# Patient Record
Sex: Female | Born: 1945 | Race: Black or African American | Hispanic: No | Marital: Married | Smoking: Former smoker
Health system: Southern US, Community
[De-identification: ages and names within clinical notes are randomized; demographics above are authoritative.]

## PROBLEM LIST (undated history)

## (undated) DIAGNOSIS — E876 Hypokalemia: Secondary | ICD-10-CM

## (undated) DIAGNOSIS — Z8719 Personal history of other diseases of the digestive system: Secondary | ICD-10-CM

## (undated) DIAGNOSIS — R0602 Shortness of breath: Secondary | ICD-10-CM

## (undated) DIAGNOSIS — J449 Chronic obstructive pulmonary disease, unspecified: Secondary | ICD-10-CM

## (undated) DIAGNOSIS — J189 Pneumonia, unspecified organism: Secondary | ICD-10-CM

## (undated) DIAGNOSIS — I519 Heart disease, unspecified: Secondary | ICD-10-CM

## (undated) DIAGNOSIS — I509 Heart failure, unspecified: Secondary | ICD-10-CM

## (undated) DIAGNOSIS — K219 Gastro-esophageal reflux disease without esophagitis: Secondary | ICD-10-CM

## (undated) DIAGNOSIS — M1990 Unspecified osteoarthritis, unspecified site: Secondary | ICD-10-CM

## (undated) DIAGNOSIS — N189 Chronic kidney disease, unspecified: Secondary | ICD-10-CM

## (undated) DIAGNOSIS — K5730 Diverticulosis of large intestine without perforation or abscess without bleeding: Secondary | ICD-10-CM

## (undated) DIAGNOSIS — I10 Essential (primary) hypertension: Secondary | ICD-10-CM

## (undated) DIAGNOSIS — R51 Headache: Secondary | ICD-10-CM

## (undated) HISTORY — PX: SEPTOPLASTY: SUR1290

## (undated) HISTORY — PX: BILATERAL SALPINGOOPHORECTOMY: SHX1223

## (undated) HISTORY — PX: CYSTOSCOPY W/ URETEROSCOPY W/ LITHOTRIPSY: SUR380

## (undated) HISTORY — PX: CHOLECYSTECTOMY: SHX55

## (undated) HISTORY — PX: LITHOTRIPSY: SUR834

## (undated) HISTORY — PX: TONSILLECTOMY: SUR1361

## (undated) HISTORY — PX: CYSTOSCOPY/RETROGRADE/URETEROSCOPY/STONE EXTRACTION WITH BASKET: SHX5317

## (undated) HISTORY — PX: ABDOMINAL HYSTERECTOMY: SHX81

## (undated) HISTORY — PX: COLONOSCOPY W/ BIOPSIES AND POLYPECTOMY: SHX1376

---

## 2008-05-26 ENCOUNTER — Emergency Department (HOSPITAL_COMMUNITY): Admission: EM | Admit: 2008-05-26 | Discharge: 2008-05-27 | Payer: Self-pay | Admitting: Emergency Medicine

## 2010-07-24 IMAGING — CR DG CHEST 2V
2 series · 2 of 2 positions shown · non-contrast
Comparison: None

CLINICAL DATA: Chest pain.  Tachycardia.  Dizziness.

CHEST - 2 VIEW

[w chest pa]
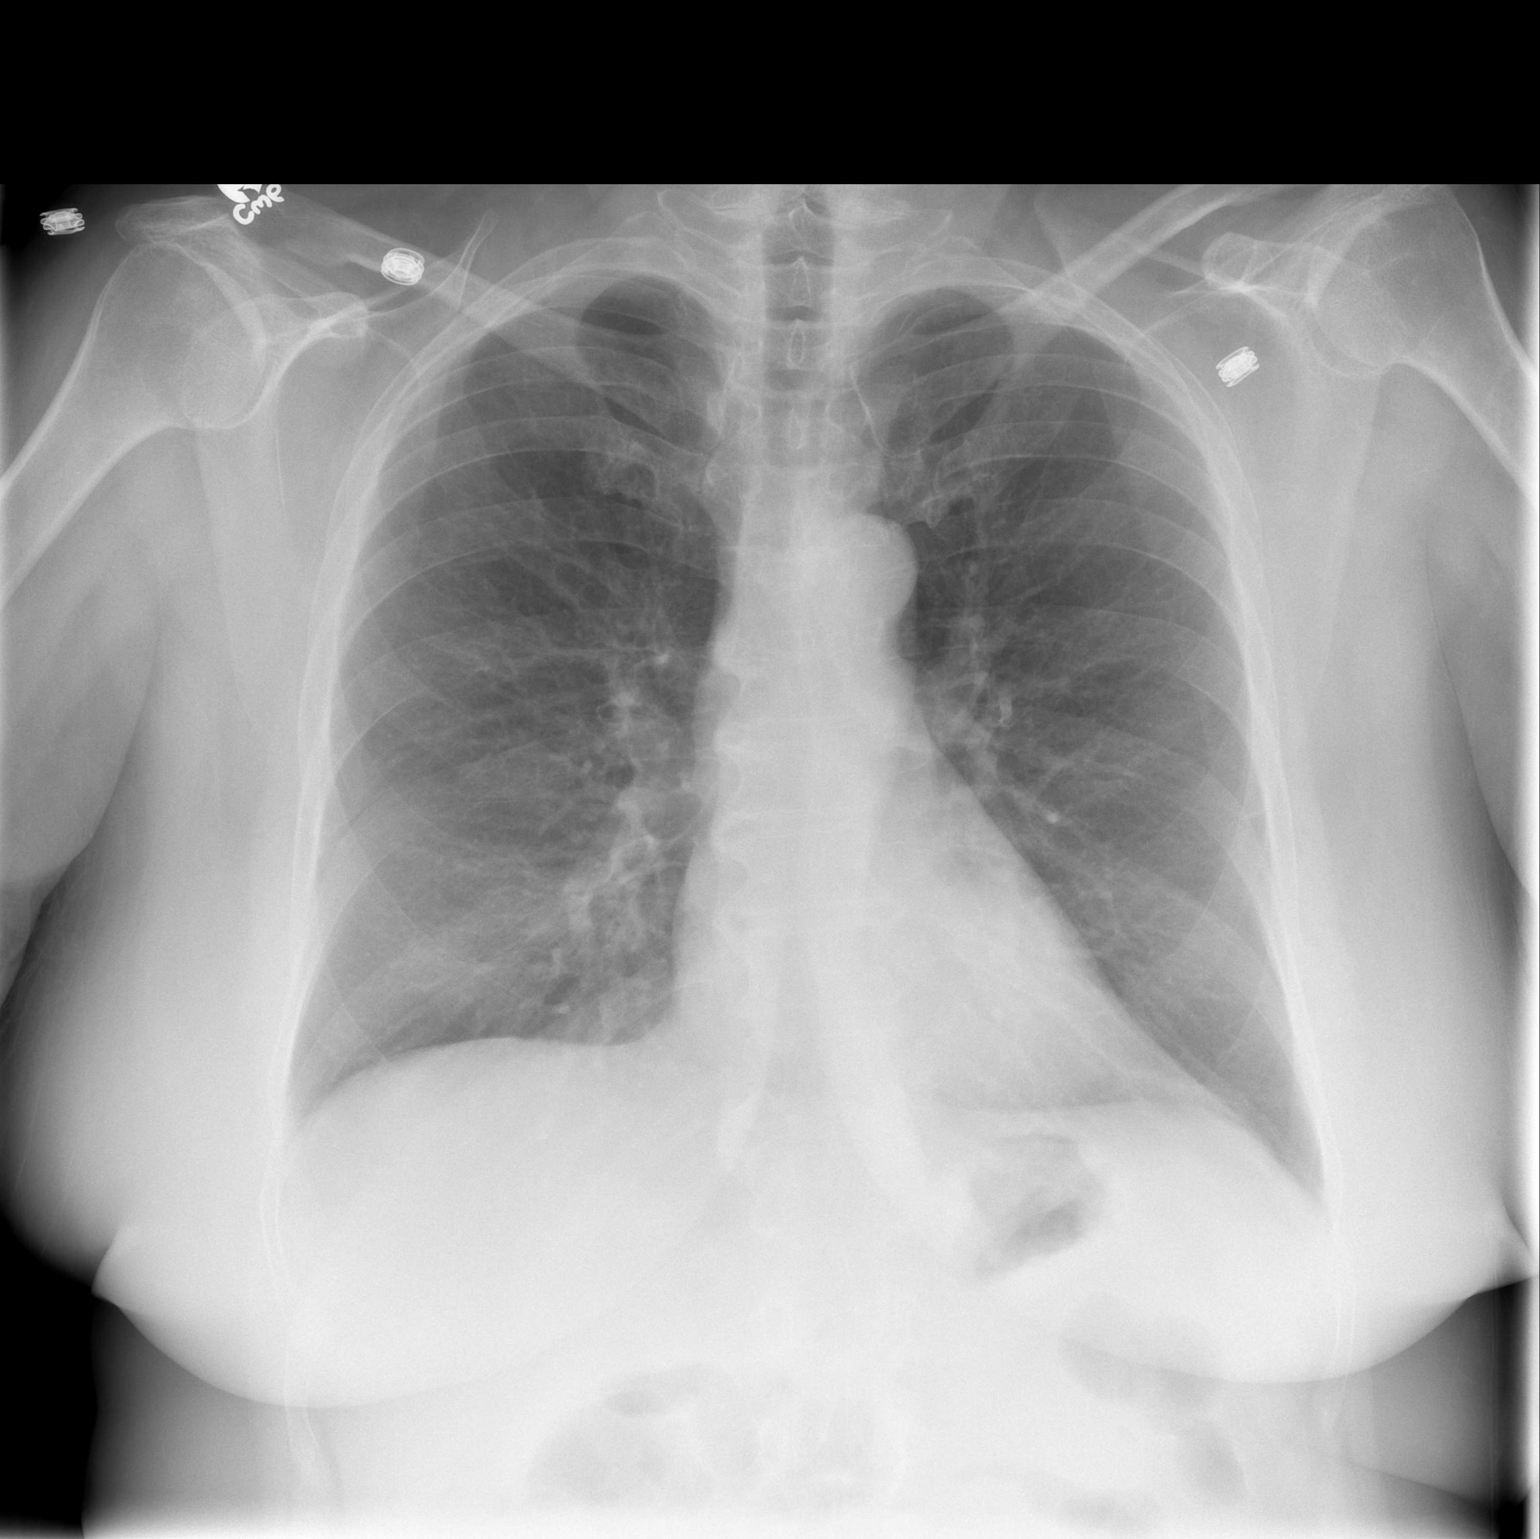

[w chest lat]
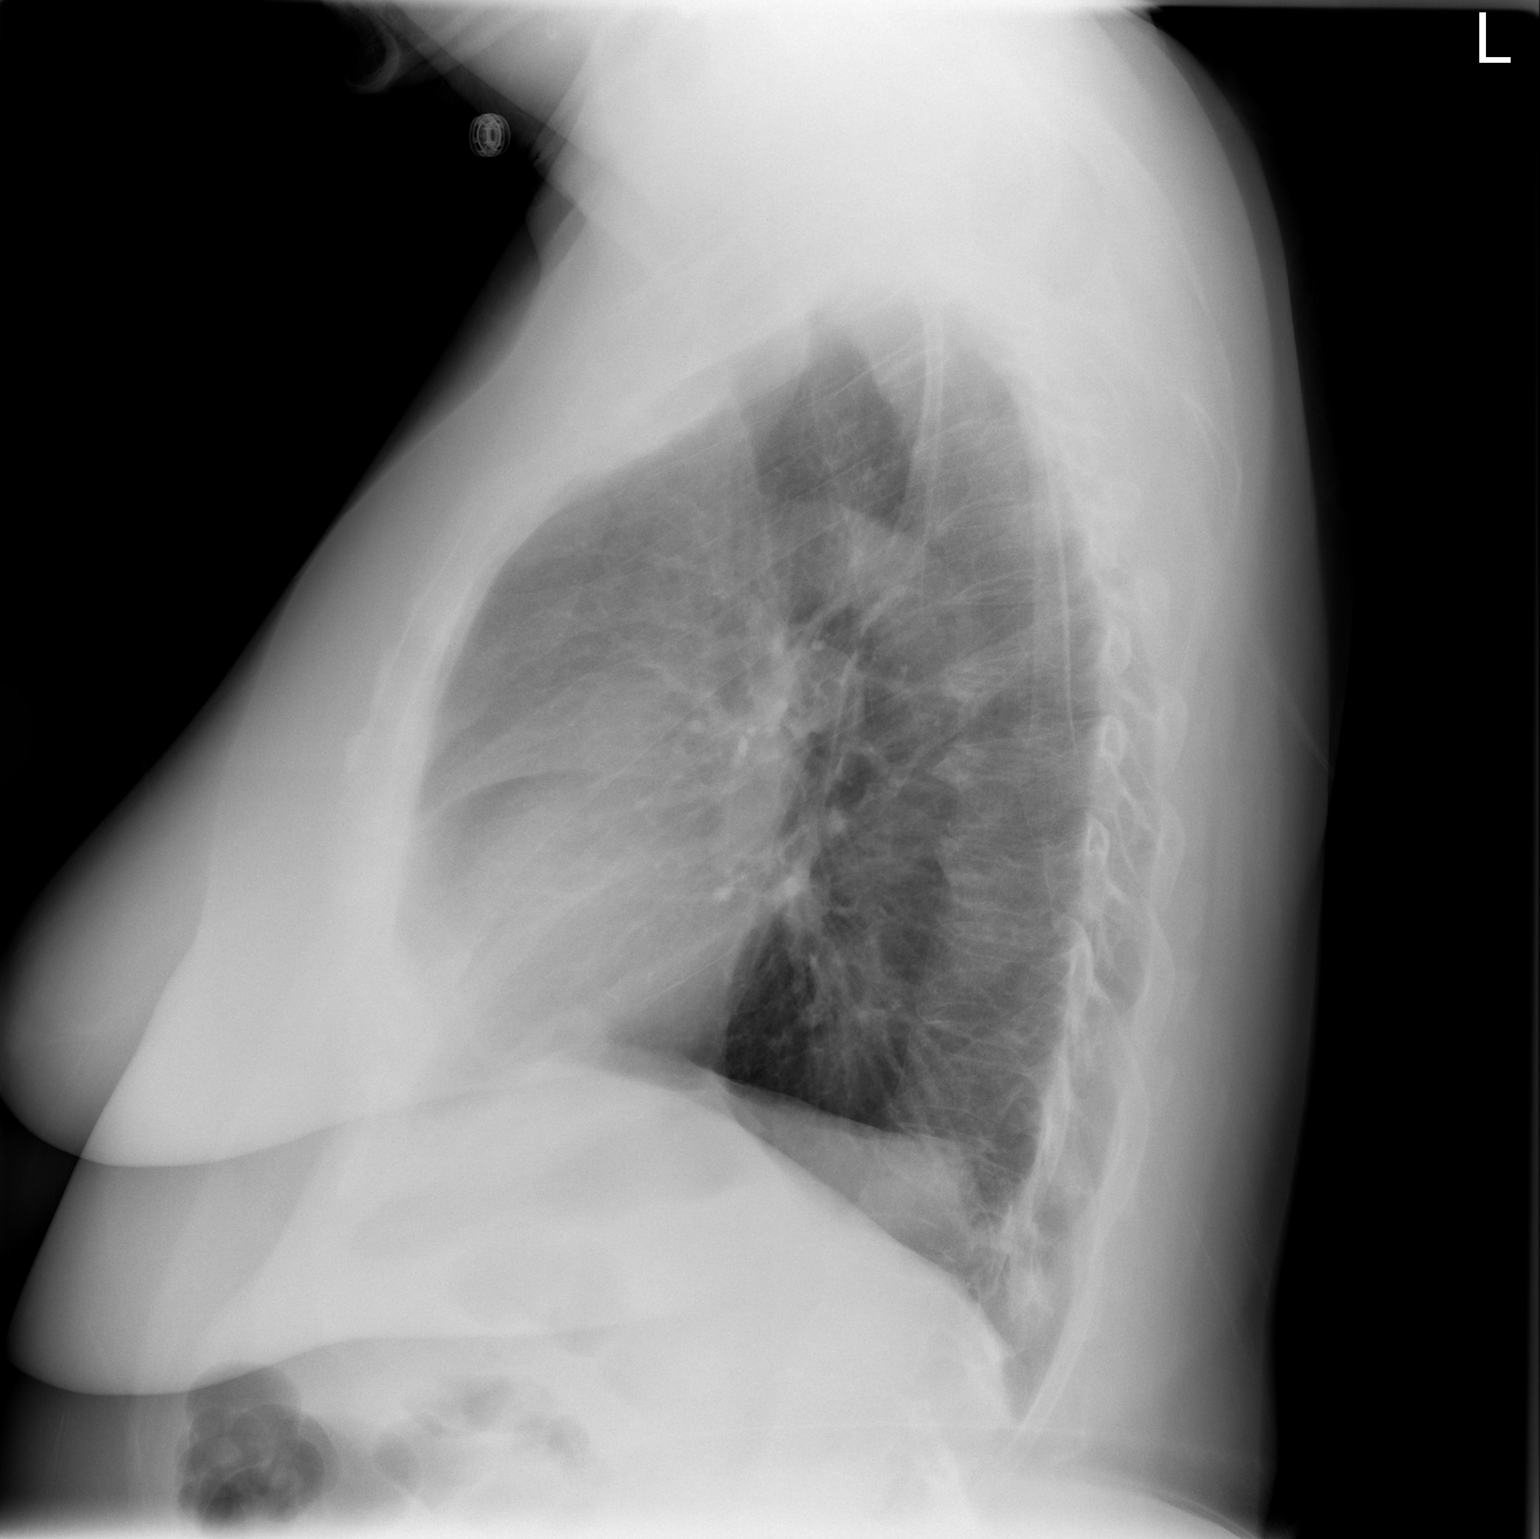

[2 of 2 positions shown; findings below may reference images not displayed]

FINDINGS: Heart size and mediastinal contours are normal.  Both
lungs are clear. There is no evidence of pleural effusion.  No mass
or adenopathy identified.
IMPRESSION: No active cardiopulmonary disease.

## 2010-07-27 LAB — BASIC METABOLIC PANEL
BUN: 8 mg/dL (ref 6–23)
CO2: 26 mEq/L (ref 19–32)
Calcium: 9.4 mg/dL (ref 8.4–10.5)
Glucose, Bld: 106 mg/dL — ABNORMAL HIGH (ref 70–99)
Sodium: 135 mEq/L (ref 135–145)

## 2010-07-27 LAB — DIFFERENTIAL
Basophils Absolute: 0.1 10*3/uL (ref 0.0–0.1)
Basophils Relative: 1 % (ref 0–1)
Eosinophils Relative: 1 % (ref 0–5)
Monocytes Absolute: 0.6 10*3/uL (ref 0.1–1.0)
Neutro Abs: 6.2 10*3/uL (ref 1.7–7.7)

## 2010-07-27 LAB — CBC
HCT: 40.6 % (ref 36.0–46.0)
Hemoglobin: 14 g/dL (ref 12.0–15.0)
MCHC: 34.5 g/dL (ref 30.0–36.0)
Platelets: 281 10*3/uL (ref 150–400)
RDW: 15.1 % (ref 11.5–15.5)

## 2010-07-27 LAB — POCT CARDIAC MARKERS

## 2011-08-22 ENCOUNTER — Encounter (HOSPITAL_BASED_OUTPATIENT_CLINIC_OR_DEPARTMENT_OTHER): Payer: Self-pay | Admitting: *Deleted

## 2011-08-22 ENCOUNTER — Other Ambulatory Visit: Payer: Self-pay | Admitting: *Deleted

## 2011-08-22 NOTE — Progress Notes (Signed)
Pt disability/asthma-had to give me a dx for every medication sh took-was an LNP-[place] cardi[place]will call for notes Asthma controlled-to bring all meds and inhaler-neb meds-may need neb tx prior to surg Wanted her to take lasix, but has a 2 hr drive here Need [address]

## 2011-08-24 ENCOUNTER — Encounter (HOSPITAL_BASED_OUTPATIENT_CLINIC_OR_DEPARTMENT_OTHER): Payer: Self-pay | Admitting: *Deleted

## 2011-08-24 ENCOUNTER — Ambulatory Visit (HOSPITAL_BASED_OUTPATIENT_CLINIC_OR_DEPARTMENT_OTHER): Payer: Federal, State, Local not specified - PPO | Admitting: *Deleted

## 2011-08-24 ENCOUNTER — Encounter (HOSPITAL_BASED_OUTPATIENT_CLINIC_OR_DEPARTMENT_OTHER)
Admission: RE | Disposition: A | Discharge status: Home / Self Care | Payer: Self-pay | Source: Ambulatory Visit | Attending: Orthopedic Surgery

## 2011-08-24 ENCOUNTER — Ambulatory Visit (HOSPITAL_BASED_OUTPATIENT_CLINIC_OR_DEPARTMENT_OTHER)
Admission: RE | Admit: 2011-08-24 | Discharge: 2011-08-24 | Disposition: A | Discharge status: Home / Self Care | Payer: Federal, State, Local not specified - PPO | Source: Ambulatory Visit | Attending: Orthopedic Surgery | Admitting: Orthopedic Surgery

## 2011-08-24 DIAGNOSIS — J4489 Other specified chronic obstructive pulmonary disease: Secondary | ICD-10-CM | POA: Insufficient documentation

## 2011-08-24 DIAGNOSIS — M21619 Bunion of unspecified foot: Secondary | ICD-10-CM | POA: Insufficient documentation

## 2011-08-24 DIAGNOSIS — M2012 Hallux valgus (acquired), left foot: Secondary | ICD-10-CM

## 2011-08-24 DIAGNOSIS — N189 Chronic kidney disease, unspecified: Secondary | ICD-10-CM | POA: Insufficient documentation

## 2011-08-24 DIAGNOSIS — I129 Hypertensive chronic kidney disease with stage 1 through stage 4 chronic kidney disease, or unspecified chronic kidney disease: Secondary | ICD-10-CM | POA: Insufficient documentation

## 2011-08-24 DIAGNOSIS — K219 Gastro-esophageal reflux disease without esophagitis: Secondary | ICD-10-CM | POA: Insufficient documentation

## 2011-08-24 DIAGNOSIS — J449 Chronic obstructive pulmonary disease, unspecified: Secondary | ICD-10-CM | POA: Insufficient documentation

## 2011-08-24 HISTORY — DX: Gastro-esophageal reflux disease without esophagitis: K21.9

## 2011-08-24 HISTORY — DX: Essential (primary) hypertension: I10

## 2011-08-24 HISTORY — DX: Hypokalemia: E87.6

## 2011-08-24 HISTORY — DX: Chronic kidney disease, unspecified: N18.9

## 2011-08-24 HISTORY — DX: Chronic obstructive pulmonary disease, unspecified: J44.9

## 2011-08-24 HISTORY — DX: Diverticulosis of large intestine without perforation or abscess without bleeding: K57.30

## 2011-08-24 HISTORY — DX: Personal history of other diseases of the digestive system: Z87.19

## 2011-08-24 HISTORY — DX: Unspecified osteoarthritis, unspecified site: M19.90

## 2011-08-24 HISTORY — DX: Heart disease, unspecified: I51.9

## 2011-08-24 HISTORY — PX: BUNIONECTOMY: SHX129

## 2011-08-24 HISTORY — DX: Pneumonia, unspecified organism: J18.9

## 2011-08-24 HISTORY — DX: Shortness of breath: R06.02

## 2011-08-24 HISTORY — DX: Heart failure, unspecified: I50.9

## 2011-08-24 HISTORY — DX: Headache: R51

## 2011-08-24 LAB — POCT I-STAT, CHEM 8
Calcium, Ion: 1.24 mmol/L (ref 1.12–1.32)
Chloride: 104 mEq/L (ref 96–112)
Glucose, Bld: 103 mg/dL — ABNORMAL HIGH (ref 70–99)
Potassium: 3.4 mEq/L — ABNORMAL LOW (ref 3.5–5.1)
Sodium: 138 mEq/L (ref 135–145)
TCO2: 24 mmol/L (ref 0–100)

## 2011-08-24 SURGERY — BUNIONECTOMY
Anesthesia: General | Site: Foot | Laterality: Left | Wound class: Clean

## 2011-08-24 MED ORDER — LIDOCAINE-EPINEPHRINE 1.5-1:200000 % IJ SOLN
INTRAMUSCULAR | Status: DC | PRN
  Administered 2011-08-24: 30 mL via INTRADERMAL

## 2011-08-24 MED ORDER — OXYCODONE-ACETAMINOPHEN 5-325 MG PO TABS: 1.0000 | ORAL_TABLET | Freq: Four times a day (QID) | ORAL | Status: AC | PRN

## 2011-08-24 MED ORDER — PROPOFOL 10 MG/ML IV EMUL
INTRAVENOUS | Status: DC | PRN
  Administered 2011-08-24: 100 mg via INTRAVENOUS

## 2011-08-24 MED ORDER — MORPHINE SULFATE 4 MG/ML IJ SOLN: 0.0500 mg/kg | INTRAMUSCULAR | Status: DC | PRN

## 2011-08-24 MED ORDER — CEFAZOLIN SODIUM-DEXTROSE 2-3 GM-% IV SOLR
2.0000 g | INTRAVENOUS | Status: AC
  Administered 2011-08-24: 2 g via INTRAVENOUS

## 2011-08-24 MED ORDER — HYDROMORPHONE HCL PF 1 MG/ML IJ SOLN: 0.2500 mg | INTRAMUSCULAR | Status: DC | PRN

## 2011-08-24 MED ORDER — POVIDONE-IODINE 7.5 % EX SOLN: Freq: Once | CUTANEOUS | Status: DC

## 2011-08-24 MED ORDER — LACTATED RINGERS IV SOLN
INTRAVENOUS | Status: DC
  Administered 2011-08-24: 08:00:00 via INTRAVENOUS

## 2011-08-24 MED ORDER — LIDOCAINE HCL (CARDIAC) 20 MG/ML IV SOLN
INTRAVENOUS | Status: DC | PRN
  Administered 2011-08-24: 100 mg via INTRAVENOUS

## 2011-08-24 MED ORDER — MIDAZOLAM HCL 2 MG/2ML IJ SOLN
1.0000 mg | INTRAMUSCULAR | Status: DC | PRN
  Administered 2011-08-24: 1 mg via INTRAVENOUS

## 2011-08-24 MED ORDER — FENTANYL CITRATE 0.05 MG/ML IJ SOLN
50.0000 ug | INTRAMUSCULAR | Status: DC | PRN
  Administered 2011-08-24: 100 ug via INTRAVENOUS

## 2011-08-24 MED ORDER — ONDANSETRON HCL 4 MG/2ML IJ SOLN: 4.0000 mg | Freq: Once | INTRAMUSCULAR | Status: DC | PRN

## 2011-08-24 MED ORDER — BUPIVACAINE-EPINEPHRINE PF 0.5-1:200000 % IJ SOLN
INTRAMUSCULAR | Status: DC | PRN
  Administered 2011-08-24: 30 mL

## 2011-08-24 MED ORDER — ONDANSETRON HCL 4 MG/2ML IJ SOLN
INTRAMUSCULAR | Status: DC | PRN
  Administered 2011-08-24: 4 mg via INTRAVENOUS

## 2011-08-24 MED ORDER — OXYCODONE-ACETAMINOPHEN 5-325 MG PO TABS
1.0000 | ORAL_TABLET | Freq: Once | ORAL | Status: AC
  Administered 2011-08-24: 1 via ORAL

## 2011-08-24 MED ORDER — PROMETHAZINE HCL 25 MG PO TABS
25.0000 mg | ORAL_TABLET | Freq: Once | ORAL | Status: AC
  Administered 2011-08-24: 25 mg via ORAL

## 2011-08-24 MED ORDER — DEXAMETHASONE SODIUM PHOSPHATE 10 MG/ML IJ SOLN
INTRAMUSCULAR | Status: DC | PRN
  Administered 2011-08-24: 10 mg via INTRAVENOUS

## 2011-08-24 SURGICAL SUPPLY — 59 items
BANDAGE CONFORM 2  STR LF (GAUZE/BANDAGES/DRESSINGS) ×2 IMPLANT
BLADE AVERAGE 25X9 (BLADE) IMPLANT
BLADE OSC/SAG .038X5.5 CUT EDG (BLADE) IMPLANT
BLADE SURG 15 STRL LF DISP TIS (BLADE) ×2 IMPLANT
BLADE SURG 15 STRL SS (BLADE) ×2
BNDG ELASTIC 2 VLCR STRL LF (GAUZE/BANDAGES/DRESSINGS) ×4 IMPLANT
BNDG ESMARK 4X9 LF (GAUZE/BANDAGES/DRESSINGS) ×2 IMPLANT
CANISTER SUCTION 1200CC (MISCELLANEOUS) IMPLANT
CLOTH BEACON ORANGE TIMEOUT ST (SAFETY) ×2 IMPLANT
COVER TABLE BACK 60X90 (DRAPES) ×2 IMPLANT
CUFF TOURNIQUET SINGLE 18IN (TOURNIQUET CUFF) IMPLANT
CUFF TOURNIQUET SINGLE 24IN (TOURNIQUET CUFF) ×2 IMPLANT
DECANTER SPIKE VIAL GLASS SM (MISCELLANEOUS) IMPLANT
DRAPE EXTREMITY T 121X128X90 (DRAPE) ×2 IMPLANT
DRAPE OEC MINIVIEW 54X84 (DRAPES) ×2 IMPLANT
DRAPE U 20/CS (DRAPES) ×2 IMPLANT
DRAPE U-SHAPE 47X51 STRL (DRAPES) ×4 IMPLANT
DRSG EMULSION OIL 3X3 NADH (GAUZE/BANDAGES/DRESSINGS) IMPLANT
DURAPREP 26ML APPLICATOR (WOUND CARE) ×2 IMPLANT
ELECT REM PT RETURN 9FT ADLT (ELECTROSURGICAL) ×2
ELECTRODE REM PT RTRN 9FT ADLT (ELECTROSURGICAL) ×1 IMPLANT
GAUZE XEROFORM 1X8 LF (GAUZE/BANDAGES/DRESSINGS) IMPLANT
GLOVE BIO SURGEON STRL SZ 6.5 (GLOVE) ×2 IMPLANT
GLOVE BIOGEL PI IND STRL 8 (GLOVE) ×2 IMPLANT
GLOVE BIOGEL PI INDICATOR 8 (GLOVE) ×2
GLOVE ECLIPSE 7.5 STRL STRAW (GLOVE) ×4 IMPLANT
GLOVE INDICATOR 7.0 STRL GRN (GLOVE) ×2 IMPLANT
GOWN BRE IMP PREV XXLGXLNG (GOWN DISPOSABLE) ×2 IMPLANT
GOWN PREVENTION PLUS XLARGE (GOWN DISPOSABLE) ×2 IMPLANT
GOWN PREVENTION PLUS XXLARGE (GOWN DISPOSABLE) ×2 IMPLANT
KIT 1-PIN ORTHOSORB 1.3X40 (Pin) ×2 IMPLANT
KWIRE 4.0 X .045IN (WIRE) IMPLANT
NEEDLE 1/2 CIR CATGUT .05X1.09 (NEEDLE) IMPLANT
NEEDLE ADDISON D1/2 CIR (NEEDLE) IMPLANT
NEEDLE HYPO 25X1 1.5 SAFETY (NEEDLE) IMPLANT
NS IRRIG 1000ML POUR BTL (IV SOLUTION) ×2 IMPLANT
PACK BASIN DAY SURGERY FS (CUSTOM PROCEDURE TRAY) ×2 IMPLANT
PAD CAST 3X4 CTTN HI CHSV (CAST SUPPLIES) IMPLANT
PADDING CAST ABS 4INX4YD NS (CAST SUPPLIES) ×1
PADDING CAST ABS COTTON 4X4 ST (CAST SUPPLIES) ×1 IMPLANT
PADDING CAST COTTON 3X4 STRL (CAST SUPPLIES)
PADDING UNDERCAST 2  STERILE (CAST SUPPLIES) ×2 IMPLANT
PENCIL BUTTON HOLSTER BLD 10FT (ELECTRODE) ×2 IMPLANT
SPONGE GAUZE 4X4 12PLY (GAUZE/BANDAGES/DRESSINGS) ×2 IMPLANT
STOCKINETTE 4X48 STRL (DRAPES) ×2 IMPLANT
SUCTION FRAZIER TIP 10 FR DISP (SUCTIONS) ×2 IMPLANT
SUT ETHILON 3 0 PS 1 (SUTURE) IMPLANT
SUT ETHILON 4 0 PS 2 18 (SUTURE) ×2 IMPLANT
SUT FIBERWIRE 2-0 18 17.9 3/8 (SUTURE) ×2
SUT VIC AB 2-0 PS2 27 (SUTURE) IMPLANT
SUT VIC AB 3-0 FS2 27 (SUTURE) IMPLANT
SUTURE FIBERWR 2-0 18 17.9 3/8 (SUTURE) ×1 IMPLANT
SYR BULB 3OZ (MISCELLANEOUS) ×2 IMPLANT
SYR CONTROL 10ML LL (SYRINGE) IMPLANT
TOWEL OR 17X24 6PK STRL BLUE (TOWEL DISPOSABLE) ×2 IMPLANT
TOWEL OR NON WOVEN STRL DISP B (DISPOSABLE) ×2 IMPLANT
TUBE CONNECTING 20X1/4 (TUBING) ×2 IMPLANT
UNDERPAD 30X30 INCONTINENT (UNDERPADS AND DIAPERS) ×4 IMPLANT
WATER STERILE IRR 1000ML POUR (IV SOLUTION) ×2 IMPLANT

## 2011-08-24 NOTE — Transfer of Care (Signed)
Immediate Anesthesia Transfer of Care Note  Patient: Kelsey York  Procedure(s) Performed: Procedure(s) (LRB): BUNIONECTOMY (Left)  Patient Location: PACU  Anesthesia Type: GA combined with regional for post-op pain  Level of Consciousness: awake, alert  and oriented  Airway & Oxygen Therapy: Patient Spontanous Breathing and Patient connected to face mask oxygen  Post-op Assessment: Report given to PACU RN, Post -op Vital signs reviewed and stable and Patient moving all extremities  Post vital signs: Reviewed and stable  Complications: No apparent anesthesia complications

## 2011-08-24 NOTE — H&P (Signed)
PREOPERATIVE H&P  Chief Complaint: l. Foot pain and bunion  HPI: Kelsey York is a 66 y.o. female who presents for evaluation of l. Foot pain. It has been present for greater than 1 year and has been worsening. She has failed conservative measures. Pain is rated as severe.  Past Medical History  Diagnosis Date  . COPD (chronic obstructive pulmonary disease)   . Hypertension   . Asthma   . CHF (congestive heart failure)   . Shortness of breath   . Pneumonia   . Chronic kidney disease   . GERD (gastroesophageal reflux disease)   . H/O hiatal hernia   . Headache   . Arthritis   . Diverticula, colon   . Hypokalemia   . Left ventricular dysfunction    Past Surgical History  Procedure Date  . Cholecystectomy   . Tonsillectomy   . Septoplasty   . Lithotripsy   . Cystoscopy w/ ureteroscopy w/ lithotripsy   . Cystoscopy/retrograde/ureteroscopy/stone extraction with basket   . Colonoscopy w/ biopsies and polypectomy   . Abdominal hysterectomy   . Bilateral salpingoophorectomy    History   Social History  . Marital Status: Married    Spouse Name: N/A    Number of Children: N/A  . Years of Education: N/A   Social History Main Topics  . Smoking status: Former Smoker    Quit date: 08/21/1989  . Smokeless tobacco: None  . Alcohol Use: Yes  . Drug Use: No  . Sexually Active:    Other Topics Concern  . None   Social History Narrative  . None   History reviewed. No pertinent family history. Allergies  Allergen Reactions  . Ampicillin Nausea And Vomiting  . Codeine Nausea And Vomiting  . Darvocet A500 (Propoxyphene N-Acetaminophen) Nausea And Vomiting  . Dilantin (Phenytoin Sodium Extended) Hives  . Flagyl (Metronidazole) Swelling  . Morphine And Related Itching  . Sulfa Antibiotics Hives   Prior to Admission medications   Medication Sig Start Date End Date Taking? Authorizing Provider  acidophilus (RISAQUAD) CAPS Take by mouth daily.   Yes Historical Provider,  MD  albuterol (PROVENTIL) (5 MG/ML) 0.5% nebulizer solution Take 2.5 mg by nebulization every 6 (six) hours as needed.   Yes Historical Provider, MD  albuterol-ipratropium (COMBIVENT) 18-103 MCG/ACT inhaler Inhale 2 puffs into the lungs every 6 (six) hours as needed.   Yes Historical Provider, MD  budesonide-formoterol (SYMBICORT) 160-4.5 MCG/ACT inhaler Inhale 2 puffs into the lungs 2 (two) times daily.   Yes Historical Provider, MD  cyclobenzaprine (FLEXERIL) 10 MG tablet Take 10 mg by mouth 3 (three) times daily as needed.   Yes Historical Provider, MD  EPINEPHrine (EPIPEN JR) 0.15 MG/0.3ML injection Inject 0.15 mg into the muscle as needed.   Yes Historical Provider, MD  famotidine (PEPCID) 40 MG tablet Take 40 mg by mouth at bedtime.   Yes Historical Provider, MD  furosemide (LASIX) 80 MG tablet Take 80 mg by mouth 1 day or 1 dose.   Yes Historical Provider, MD  lisinopril (PRINIVIL,ZESTRIL) 2.5 MG tablet Take 2.5 mg by mouth daily.   Yes Historical Provider, MD  mometasone (NASONEX) 50 MCG/ACT nasal spray Place 2 sprays into the nose daily.   Yes Historical Provider, MD  montelukast (SINGULAIR) 10 MG tablet Take 10 mg by mouth at bedtime.   Yes Historical Provider, MD  omeprazole (PRILOSEC) 20 MG capsule Take 20 mg by mouth daily.   Yes Historical Provider, MD  potassium chloride (K-DUR,KLOR-CON) 10 MEQ  tablet Take 10 mEq by mouth 2 (two) times daily.   Yes Historical Provider, MD  verapamil (CALAN-SR) 240 MG CR tablet Take 240 mg by mouth at bedtime.   Yes Historical Provider, MD  guaiFENesin (MUCINEX) 600 MG 12 hr tablet Take 1,200 mg by mouth 2 (two) times daily.    Historical Provider, MD  phenazopyridine (PYRIDIUM) 200 MG tablet Take 200 mg by mouth 3 (three) times daily as needed.    Historical Provider, MD  promethazine (PHENERGAN) 25 MG tablet Take 25 mg by mouth every 6 (six) hours as needed.    Historical Provider, MD     Positive ROS: none  All other systems have been reviewed  and were otherwise negative with the exception of those mentioned in the HPI and as above.  Physical Exam: Filed Vitals:   08/24/11 0714  BP: 135/82  Pulse: 96  Temp: 97.8 F (36.6 C)  Resp: 20    General: Alert, no acute distress Cardiovascular: No pedal edema Respiratory: No cyanosis, no use of accessory musculature GI: No organomegaly, abdomen is soft and non-tender Skin: No lesions in the area of chief complaint Neurologic: Sensation intact distally Psychiatric: Patient is competent for consent with normal mood and affect Lymphatic: No axillary or cervical lymphadenopathy  MUSCULOSKELETAL: l. Foot painful medial eminence.  Hallux valgus.    X-XAY:  13 degree intermetatarsal angle with hallux valgus  Assessment/Plan: left foot bunion Plan for Procedure(s): BUNIONECTOMY with distal chevron  The risks benefits and alternatives were discussed with the patient including but not limited to the risks of nonoperative treatment, versus surgical intervention including infection, bleeding, nerve injury, malunion, nonunion, hardware prominence, hardware failure, need for hardware removal, blood clots, cardiopulmonary complications, morbidity, mortality, among others, and they were willing to proceed.  Predicted outcome is good, although there will be at least a six to nine month expected recovery.  Julien Oscar L, MD 08/24/2011 8:30 AM

## 2011-08-24 NOTE — Anesthesia Postprocedure Evaluation (Signed)
Anesthesia Post Note  Patient: Kelsey York  Procedure(s) Performed: Procedure(s) (LRB): BUNIONECTOMY (Left)  Anesthesia type: general  Patient location: PACU  Post pain: Pain level controlled  Post assessment: Patient's Cardiovascular Status Stable  Last Vitals:  Filed Vitals:   08/24/11 1045  BP: 109/62  Pulse: 81  Temp:   Resp: 16    Post vital signs: Reviewed and stable  Level of consciousness: sedated  Complications: No apparent anesthesia complications

## 2011-08-24 NOTE — Brief Op Note (Signed)
08/24/2011  3:45 PM  PATIENT:  Kelsey York  66 y.o. female  PRE-OPERATIVE DIAGNOSIS:  left foot bunion  POST-OPERATIVE DIAGNOSIS:  left foot bunion  PROCEDURE:  Procedure(s) (LRB): BUNIONECTOMY (Left)  SURGEON:  Surgeon(s) and Role:    * Harvie Junior, MD - Primary  PHYSICIAN ASSISTANT:   ASSISTANTS: bethune   ANESTHESIA:   general  EBL:  Total I/O In: 1402 [P.O.:452; I.V.:950] Out: -   BLOOD ADMINISTERED:none  DRAINS: none   LOCAL MEDICATIONS USED:  MARCAINE     SPECIMEN:  No Specimen  DISPOSITION OF SPECIMEN:  N/A  COUNTS:  YES  TOURNIQUET:   Total Tourniquet Time Documented: Calf (Left) - 60 minutes  DICTATION: .Other Dictation: Dictation Number [id number]  PLAN OF CARE: Discharge to home after PACU  PATIENT DISPOSITION:  PACU - hemodynamically stable.   Delay start of Pharmacological VTE agent (>24hrs) due to surgical blood loss or risk of bleeding: not applicable

## 2011-08-24 NOTE — OR Nursing (Signed)
Time out documented incorrectly 855 correct time

## 2011-08-24 NOTE — Discharge Instructions (Signed)
        Regional Anesthesia Blocks  1. Numbness or the inability to move the "blocked" extremity may last from 3-48 hours after placement. The length of time depends on the medication injected and your individual response to the medication. If the numbness is not going away after 48 hours, call your surgeon.  2. The extremity that is blocked will need to be protected until the numbness is gone and the  Strength has returned. Because you cannot feel it, you will need to take extra care to avoid injury. Because it may be weak, you may have difficulty moving it or using it. You may not know what position it is in without looking at it while the block is in effect.  3. For blocks in the legs and feet, returning to weight bearing and walking needs to be done carefully. You will need to wait until the numbness is entirely gone and the strength has returned. You should be able to move your leg and foot normally before you try and bear weight or walk. You will need someone to be with you when you first try to ensure you do not fall and possibly risk injury.  4. Bruising and tenderness at the needle site are common side effects and will resolve in a few days.  5. Persistent numbness or new problems with movement should be communicated to the surgeon or the [facility] [phone] [facility] [phone]).  Post Anesthesia Home Care Instructions  Activity: Get plenty of rest for the remainder of the day. A responsible adult should stay with you for 24 hours following the procedure.  For the next 24 hours, DO NOT: -Drive a car -Advertising copywriter -Drink alcoholic beverages -Take any medication unless instructed by your physician -Make any legal decisions or sign important papers.  Meals: Start with liquid foods such as gelatin or soup. Progress to regular foods as tolerated. Avoid greasy, spicy, heavy foods. If nausea and/or vomiting occur, drink only clear liquids  until the nausea and/or vomiting subsides. Call your physician if vomiting continues.  Special Instructions/Symptoms: Your throat may feel dry or sore from the anesthesia or the breathing tube placed in your throat during surgery. If this causes discomfort, gargle with warm salt water. The discomfort should disappear within 24 hours.  Ambulate partial wgt bearing on your left foot with crutches. Elevate and ice your foot as much as possible.

## 2011-08-24 NOTE — Progress Notes (Signed)
Offered pt pain medication and refused -- said she did not want to fall asleep again.Pt requesting Zofran or something for nausea because percocet makes her nauseous. Called to OR and Gus Puma PA said he would call in a nausea medication for pt - get pharmacy name and number.

## 2011-08-24 NOTE — Anesthesia Preprocedure Evaluation (Addendum)
Anesthesia Evaluation  Patient identified by MRN, date of birth, ID band Patient awake    Reviewed: Allergy & Precautions, H&P , NPO status , Patient's Chart, lab work & pertinent test results  Airway Mallampati: I TM Distance: >3 FB Neck ROM: Full    Dental   Pulmonary shortness of breath and with exertion, asthma , COPD         Cardiovascular hypertension, Pt. on medications     Neuro/Psych    GI/Hepatic GERD-  Medicated and Controlled,  Endo/Other    Renal/GU      Musculoskeletal   Abdominal   Peds  Hematology   Anesthesia Other Findings   Reproductive/Obstetrics                           Anesthesia Physical Anesthesia Plan  ASA: III  Anesthesia Plan: General   Post-op Pain Management: MAC Combined w/ Regional for Post-op pain   Induction: Intravenous  Airway Management Planned: LMA  Additional Equipment:   Intra-op Plan:   Post-operative Plan: Extubation in OR  Informed Consent: I have reviewed the patients History and Physical, chart, labs and discussed the procedure including the risks, benefits and alternatives for the proposed anesthesia with the patient or authorized representative who has indicated his/her understanding and acceptance.     Plan Discussed with: CRNA and Surgeon  Anesthesia Plan Comments:         Anesthesia Quick Evaluation

## 2011-08-24 NOTE — Anesthesia Procedure Notes (Addendum)
Anesthesia Regional Block:  Popliteal block  Pre-Anesthetic Checklist: ,, timeout performed, Correct Patient, Correct Site, Correct Laterality, Correct Procedure, Correct Position, site marked, Risks and benefits discussed,  Surgical consent,  Pre-op evaluation,  At surgeon's request and post-op pain management  Laterality: Left  Prep: chloraprep       Needles:  Injection technique: Single-shot  Needle Type: Stimulator Needle - 80          Additional Needles:  Procedures: nerve stimulator Popliteal block  Nerve Stimulator or Paresthesia:  Response: 0.4 mA,   Additional Responses:   Narrative:  Start time: 08/24/2011 8:25 AM End time: 08/24/2011 8:36 AM Injection made incrementally with aspirations every 5 mL.  Performed by: Personally  Anesthesiologist: Arta Bruce MD  Popliteal block Procedure Name: LMA Insertion Date/Time: 08/24/2011 8:47 AM Performed by: Meyer Russel Pre-anesthesia Checklist: Patient identified, Emergency Drugs available, Suction available and Patient being monitored Patient Re-evaluated:Patient Re-evaluated prior to inductionOxygen Delivery Method: Circle System Utilized Preoxygenation: Pre-oxygenation with 100% oxygen Intubation Type: IV induction Ventilation: Mask ventilation without difficulty LMA: LMA inserted LMA Size: 4.0 Number of attempts: 1 Airway Equipment and Method: bite block Placement Confirmation: positive ETCO2 and breath sounds checked- equal and bilateral Tube secured with: Tape Dental Injury: Teeth and Oropharynx as per pre-operative assessment

## 2011-08-25 NOTE — Op Note (Signed)
NAME:  Kelsey York, Kelsey York                ACCOUNT NO.:  [id number]  MEDICAL RECORD NO.:  [id number]  LOCATION:                                 FACILITY:  PHYSICIAN:  Harvie Junior, M.D.        DATE OF BIRTH:  DATE OF PROCEDURE:  08/24/2011 DATE OF DISCHARGE:                              OPERATIVE REPORT   PREOPERATIVE DIAGNOSIS:  Painful bunion, left foot.  POSTOPERATIVE DIAGNOSIS:  Painful bunion, left foot.  PRINCIPAL PROCEDURE:  Left distal Chevron bunionectomy with a medial capsular closure fixed with a bioabsorbable pin.  SURGEON:  Harvie Junior, MD  ASSISTANT:  Marshia Ly, PA  ANESTHESIA:  General.  BRIEF HISTORY:  Kelsey York is a 66 year old female with long history of having had significant pain in her left foot.  She had large bunion deformity with a 13-degree intermetatarsal angle and a 25-degree hallux valgus angle.  We talked about treatment options.  It was felt that given the significant amount of pain, she was having that, bunion correction will be appropriate.  We talked about Chevron osteotomy versus other alternatives, I felt that the Paula Libra would be adequate in relieving her pain and giving her reasonable correction and she wished to proceed with this and she was brought to the operating room for this procedure.  We had a long discussion preoperatively about the fact that this is a pain relieving operation and then functionally, she will do fine, but may not get back to wearing all forms of shoes that she would like to wear and she certainly understood this going in as it was just for pain relief that we did the surgery.  PROCEDURE:  The patient was taken to the operating room.  After adequate anesthesia was obtained with general anesthetic, the patient was placed supine on the operating table.  The left leg was then prepped and draped in usual sterile fashion.  Following this, the leg was exsanguinated. Blood pressure tourniquet was inflated to 250  mmHg.  Following this, a midline incision was made down the metatarsal over the phalanx, subcutaneous tissue down the level of the joint capsule.  Care being taken to dissect free all the nerves around the capsule.  At this point, a horizontal opening was made in the capsule to allow for the capsule to be closed and correct the toe position.  Once this was done, the dorsomedial aspect of the proximal flap was released giving full access to the medial eminence and two retractors were put in place.  The medial eminence was resected.  Following this, attention was turned towards the head of the metatarsal and the midpoint was identified and then a 60- degree angle was achieved giving access to the front and back of the metatarsal giving [place] a very nice Chevron osteotomy.  Once this was completed, we were able to lateralizing the metatarsal head and medializing the shaft.  Once this was done, the toe position looked just perfect.  At this point, we left the lateral side of the toe alone.  We did need to do any kind of releases over there and the extensor tendon looked fine at this point.  Once this was done, we locked the same with a bioabsorbable pin under fluoroscopic guidance.  The bioabsorbable pin got excellent fixation.  At that point, we then did the capsular closure.  We used 2-0 FiberWire and got very nice pants-over-vest closure of this medial capsule and holding the toe in a perfectly corrected position.  At this point, the wound was irrigated, suctioned dry, just closed with nylon stitching of the skin, layer closure and a sterile compressive dressing was applied with a toe spica splint, and the patient taken to recovery room, she was noted to be in a satisfactory condition.  Estimated blood loss for the procedure was none.     Harvie Junior, M.D.     Kelsey York  D:  08/24/2011  T:  08/25/2011  Job:  [id number]

## 2011-08-26 ENCOUNTER — Encounter (HOSPITAL_BASED_OUTPATIENT_CLINIC_OR_DEPARTMENT_OTHER): Payer: Self-pay | Admitting: Orthopedic Surgery

## 2022-10-16 ENCOUNTER — Emergency Department (HOSPITAL_COMMUNITY): Payer: Federal, State, Local not specified - PPO

## 2022-10-16 ENCOUNTER — Encounter (HOSPITAL_COMMUNITY): Payer: Self-pay

## 2022-10-16 ENCOUNTER — Emergency Department (HOSPITAL_COMMUNITY)
Admission: EM | Admit: 2022-10-16 | Discharge: 2022-10-16 | Disposition: A | Discharge status: Home / Self Care | Payer: Federal, State, Local not specified - PPO | Source: Home / Self Care | Attending: Emergency Medicine | Admitting: Emergency Medicine

## 2022-10-16 DIAGNOSIS — J449 Chronic obstructive pulmonary disease, unspecified: Secondary | ICD-10-CM | POA: Diagnosis not present

## 2022-10-16 DIAGNOSIS — N189 Chronic kidney disease, unspecified: Secondary | ICD-10-CM | POA: Diagnosis not present

## 2022-10-16 DIAGNOSIS — J45909 Unspecified asthma, uncomplicated: Secondary | ICD-10-CM | POA: Insufficient documentation

## 2022-10-16 DIAGNOSIS — I509 Heart failure, unspecified: Secondary | ICD-10-CM | POA: Insufficient documentation

## 2022-10-16 DIAGNOSIS — I130 Hypertensive heart and chronic kidney disease with heart failure and stage 1 through stage 4 chronic kidney disease, or unspecified chronic kidney disease: Secondary | ICD-10-CM | POA: Insufficient documentation

## 2022-10-16 DIAGNOSIS — Z79899 Other long term (current) drug therapy: Secondary | ICD-10-CM | POA: Diagnosis not present

## 2022-10-16 DIAGNOSIS — M7989 Other specified soft tissue disorders: Secondary | ICD-10-CM | POA: Insufficient documentation

## 2022-10-16 LAB — BRAIN NATRIURETIC PEPTIDE: B Natriuretic Peptide: 24.5 pg/mL (ref 0.0–100.0)

## 2022-10-16 LAB — CBC WITH DIFFERENTIAL/PLATELET
Abs Immature Granulocytes: 0.44 10*3/uL — ABNORMAL HIGH (ref 0.00–0.07)
Basophils Absolute: 0.1 10*3/uL (ref 0.0–0.1)
Basophils Relative: 0 %
Eosinophils Absolute: 0 10*3/uL (ref 0.0–0.5)
Eosinophils Relative: 0 %
HCT: 42.4 % (ref 36.0–46.0)
Hemoglobin: 13.6 g/dL (ref 12.0–15.0)
Immature Granulocytes: 3 %
Lymphocytes Relative: 17 %
Lymphs Abs: 2.2 10*3/uL (ref 0.7–4.0)
MCH: 29.5 pg (ref 26.0–34.0)
MCHC: 32.1 g/dL (ref 30.0–36.0)
MCV: 92 fL (ref 80.0–100.0)
Monocytes Absolute: 0.8 10*3/uL (ref 0.1–1.0)
Monocytes Relative: 6 %
Neutro Abs: 10 10*3/uL — ABNORMAL HIGH (ref 1.7–7.7)
Neutrophils Relative %: 74 %
Platelets: 242 10*3/uL (ref 150–400)
RBC: 4.61 MIL/uL (ref 3.87–5.11)
RDW: 16.3 % — ABNORMAL HIGH (ref 11.5–15.5)
WBC: 13.5 10*3/uL — ABNORMAL HIGH (ref 4.0–10.5)
nRBC: 0 % (ref 0.0–0.2)

## 2022-10-16 LAB — BASIC METABOLIC PANEL
Anion gap: 10 (ref 5–15)
BUN: 18 mg/dL (ref 8–23)
CO2: 22 mmol/L (ref 22–32)
Calcium: 8.9 mg/dL (ref 8.9–10.3)
Chloride: 102 mmol/L (ref 98–111)
Creatinine, Ser: 1.14 mg/dL — ABNORMAL HIGH (ref 0.44–1.00)
GFR, Estimated: 50 mL/min — ABNORMAL LOW (ref >60)
Glucose, Bld: 204 mg/dL — ABNORMAL HIGH (ref 70–99)
Potassium: 3.8 mmol/L (ref 3.5–5.1)
Sodium: 134 mmol/L — ABNORMAL LOW (ref 135–145)

## 2022-10-16 LAB — HEPATIC FUNCTION PANEL
ALT: 26 U/L (ref 0–44)
AST: 22 U/L (ref 15–41)
Albumin: 3.5 g/dL (ref 3.5–5.0)
Alkaline Phosphatase: 53 U/L (ref 38–126)
Bilirubin, Direct: <0.1 mg/dL (ref 0.0–0.2)
Total Bilirubin: 0.4 mg/dL (ref 0.3–1.2)
Total Protein: 6.7 g/dL (ref 6.5–8.1)

## 2022-10-16 LAB — TROPONIN I (HIGH SENSITIVITY): Troponin I (High Sensitivity): 4 ng/L (ref <18)

## 2022-10-16 NOTE — Discharge Instructions (Signed)
IMPORTANT PATIENT INSTRUCTIONS:  You have been scheduled for an Outpatient Vascular Study at [facility].    If tomorrow is a Saturday, Sunday or holiday, please go to the [facility] Registration Desk at 11 am tomorrow morning and tell them you are there for a vascular study.   If tomorrow is a weekday (Monday-Friday), please go to [facility] Registration at 11 am and tell them you are there for a vascular study. 

## 2022-10-16 NOTE — ED Provider Notes (Signed)
[place] EMERGENCY DEPARTMENT AT [facility] Provider Note   CSN: [id number] Arrival date & time: 10/16/22  1910     History  Chief Complaint  Patient presents with   Leg Pain    Kelsey York is a 77 y.o. female.  Patient is a 77 year old female with a past medical history of CHF, asthma, hypertension, COPD, CKD presenting to the emergency department with lower extremity swelling.  The patient states that she has had right lower extremity swelling for about the last that she has had pain in her foot and leg up to her knee.  She denies any trauma or falls.  She states that she has had some mild shortness of breath and intermittent chest pains though states that she does frequently have this related to her CHF and COPD and is unsure if this is new.  She denies any, recently travel in the car or plane, hormone use or cancer history.  She states that she was seen at an outside hospital July 4 and had a positive D-dimer and have both so they started her on Eliquis and scheduled for an outpatient vascular study which she has not yet been able to obtain.  The patient states that "I just need to know if I have a blood clot".  The history is provided by the patient.  Leg Pain      Home Medications Prior to Admission medications   Medication Sig Start Date End Date Taking? Authorizing Provider  acidophilus (RISAQUAD) CAPS Take by mouth daily.    [provider]  albuterol (PROVENTIL) (5 MG/ML) 0.5% nebulizer solution Take 2.5 mg by nebulization every 6 (six) hours as needed.    [provider]  albuterol-ipratropium (COMBIVENT) 18-103 MCG/ACT inhaler Inhale 2 puffs into the lungs every 6 (six) hours as needed.    [provider]  budesonide-formoterol (SYMBICORT) 160-4.5 MCG/ACT inhaler Inhale 2 puffs into the lungs 2 (two) times daily.    [provider]  cyclobenzaprine (FLEXERIL) 10 MG tablet Take 10 mg by mouth 3 (three) times daily as  needed.    [provider]  EPINEPHrine (EPIPEN JR) 0.15 MG/0.3ML injection Inject 0.15 mg into the muscle as needed.    [provider]  famotidine (PEPCID) 40 MG tablet Take 40 mg by mouth at bedtime.    [provider]  furosemide (LASIX) 80 MG tablet Take 80 mg by mouth 1 day or 1 dose.    [provider]  guaiFENesin (MUCINEX) 600 MG 12 hr tablet Take 1,200 mg by mouth 2 (two) times daily.    [provider]  lisinopril (PRINIVIL,ZESTRIL) 2.5 MG tablet Take 2.5 mg by mouth daily.    [provider]  mometasone (NASONEX) 50 MCG/ACT nasal spray Place 2 sprays into the nose daily.    [provider]  montelukast (SINGULAIR) 10 MG tablet Take 10 mg by mouth at bedtime.    [provider]  omeprazole (PRILOSEC) 20 MG capsule Take 20 mg by mouth daily.    [provider]  phenazopyridine (PYRIDIUM) 200 MG tablet Take 200 mg by mouth 3 (three) times daily as needed.    [provider]  potassium chloride (K-DUR,KLOR-CON) 10 MEQ tablet Take 10 mEq by mouth 2 (two) times daily.    [provider]  promethazine (PHENERGAN) 25 MG tablet Take 25 mg by mouth every 6 (six) hours as needed.    [provider]  verapamil (CALAN-SR) 240 MG CR tablet  Take 240 mg by mouth at bedtime.    [provider]      Allergies    Ampicillin, Codeine, Darvocet a500 [propoxyphene n-acetaminophen], Dilantin [phenytoin sodium extended], Flagyl [metronidazole], Morphine and codeine, and Sulfa antibiotics    Review of Systems   Review of Systems  Physical Exam Updated Vital Signs BP 129/73   Pulse 79   Temp 98.3 F (36.8 C) (Oral)   Resp 13   Ht 5\' 4"  (1.626 m)   Wt 93 kg   SpO2 99%   BMI 35.19 kg/m  Physical Exam Vitals and nursing note reviewed.  Constitutional:      General: She is not in acute distress.    Appearance: Normal appearance.  HENT:     Head: Normocephalic and atraumatic.      Nose: Nose normal.     Mouth/Throat:     Mouth: Mucous membranes are moist.     Pharynx: Oropharynx is clear.  Eyes:     Extraocular Movements: Extraocular movements intact.     Conjunctiva/sclera: Conjunctivae normal.  Cardiovascular:     Rate and Rhythm: Normal rate and regular rhythm.     Heart sounds: Normal heart sounds.  Pulmonary:     Effort: Pulmonary effort is normal.     Breath sounds: Normal breath sounds.  Abdominal:     General: Abdomen is flat.     Palpations: Abdomen is soft.  Musculoskeletal:        General: Tenderness (R ankle, no calf tenderness) present. Normal range of motion.     Cervical back: Normal range of motion and neck supple.     Right lower leg: Edema (non-pitting) present.     Left lower leg: No edema.  Skin:    General: Skin is warm and dry.  Neurological:     General: No focal deficit present.     Mental Status: She is alert and oriented to person, place, and time.  Psychiatric:        Mood and Affect: Mood normal.        Behavior: Behavior normal.     ED Results / Procedures / Treatments   Labs (all labs ordered are listed, but only abnormal results are displayed) Labs Reviewed  BASIC METABOLIC PANEL - Abnormal; Notable for the following components:      Result Value   Sodium 134 (*)    Glucose, Bld 204 (*)    Creatinine, Ser 1.14 (*)    GFR, Estimated 50 (*)    All other components within normal limits  CBC WITH DIFFERENTIAL/PLATELET - Abnormal; Notable for the following components:   WBC 13.5 (*)    RDW 16.3 (*)    Neutro Abs 10.0 (*)    Abs Immature Granulocytes 0.44 (*)    All other components within normal limits  BRAIN NATRIURETIC PEPTIDE  HEPATIC FUNCTION PANEL  TROPONIN I (HIGH SENSITIVITY)  TROPONIN I (HIGH SENSITIVITY)    EKG EKG Interpretation Date/Time:  Sunday October 16 2022 20:10:49 EDT Ventricular Rate:  87 PR Interval:  124 QRS Duration:  88 QT Interval:  347 QTC Calculation: 418 R Axis:   23  Text  Interpretation: Sinus rhythm Low voltage, precordial leads No significant change since last tracing Confirmed by Elayne Snare ([id number]) on 10/16/2022 8:11:48 PM  Radiology DG Chest 2 View  Result Date: 10/16/2022 CLINICAL DATA:  Shortness of breath, right leg swelling. History of CHF. EXAM: CHEST - 2 VIEW COMPARISON:  05/26/2008. FINDINGS: The heart size and mediastinal  contours are within normal limits. Lung volumes are low. No consolidation, effusion, or pneumothorax. Degenerative changes are noted in the thoracic spine. No acute osseous abnormality. IMPRESSION: No active cardiopulmonary disease. Electronically Signed   By: Thornell Sartorius M.D.   On: 10/16/2022 20:52    Procedures Procedures    Medications Ordered in ED Medications - No data to display  ED Course/ Medical Decision Making/ A&P Clinical Course as of 10/16/22 2158  Wynelle Link Oct 16, 2022  2139 Labs with mild hyperglycemia otherwise within normal range. No signs of CHF exacerbation. She will be instructed to continue Eliquis as prescribed and will be offered to scheduled DVT US here for follow up [VK]    Clinical Course User Index [VK] Rexford Maus, DO                             Medical Decision Making This patient presents to the ED with chief complaint(s) of RLE swelling with pertinent past medical history of CHF, COPD, HTN, CKD which further complicates the presenting complaint. The complaint involves an extensive differential diagnosis and also carries with it a high risk of complications and morbidity.    The differential diagnosis includes DVT,  CHF exacerbation, AKI, hypoalbuminemia, venous insufficiency, no trauma or falls making fracture or dislocation unlikely    Additional history obtained: Additional history obtained from family Records reviewed Care Everywhere/External Records  ED Course and Reassessment: On patient's arrival to the emergency department she is hemodynamically stable in no acute distress.   She does have unilateral right lower extremity swelling with some mild tenderness around the ankle.  She is neurovascularly intact.  She was started on Eliquis on July 4 and is on appropriate therapy despite not yet having a confirmed DVT.  Patient will additionally have workup to evaluate for possible CHF or other etiology of her lower extremity swelling.  She was informed that our vascular techs are no longer here this evening and we will not be able to obtain a DVT study this evening but may schedule her for an outpatient study in the morning.  Independent labs interpretation:  The following labs were independently interpreted: within normal range  Independent visualization of imaging: - I independently visualized the following imaging with scope of interpretation limited to determining acute life threatening conditions related to emergency care: CXR, which revealed no acute disease  Consultation: - Consulted or discussed management/test interpretation w/ external professional: N/A  Consideration for admission or further workup: patient is stable for discharge for outpatient DVT study with primary care follow up Social Determinants of health: N/A    Amount and/or Complexity of Data Reviewed Labs: ordered. Radiology: ordered.          Final Clinical Impression(s) / ED Diagnoses Final diagnoses:  Swelling of right lower extremity    Rx / DC Orders ED Discharge Orders          Ordered    LE VENOUS        10/16/22 2156              Rexford Maus, DO 10/16/22 2158

## 2022-10-16 NOTE — ED Notes (Signed)
US PIV placed. 

## 2022-10-16 NOTE — ED Triage Notes (Signed)
Pt states that she has right leg swelling x 10 days. Pt reports that she has CHF and this swelling is different than her usual CHF edema

## 2022-10-16 NOTE — ED Notes (Signed)
Pt educated on DC instructions and verbalizes understanding, pt taken to care via wheelchair, RR even and unlabored

## 2022-10-17 ENCOUNTER — Ambulatory Visit (HOSPITAL_COMMUNITY)
Admission: RE | Admit: 2022-10-17 | Discharge: 2022-10-17 | Disposition: A | Discharge status: Home / Self Care | Payer: Federal, State, Local not specified - PPO | Source: Ambulatory Visit | Attending: Emergency Medicine | Admitting: Emergency Medicine

## 2022-10-17 ENCOUNTER — Other Ambulatory Visit (HOSPITAL_COMMUNITY): Payer: Self-pay

## 2022-10-17 ENCOUNTER — Encounter: Payer: Self-pay | Admitting: Student-PharmD

## 2022-10-17 ENCOUNTER — Encounter (HOSPITAL_COMMUNITY): Payer: Self-pay

## 2022-10-17 ENCOUNTER — Ambulatory Visit (HOSPITAL_BASED_OUTPATIENT_CLINIC_OR_DEPARTMENT_OTHER)
Admission: RE | Admit: 2022-10-17 | Discharge: 2022-10-17 | Disposition: A | Discharge status: Home / Self Care | Payer: Federal, State, Local not specified - PPO | Source: Ambulatory Visit | Attending: Vascular Surgery | Admitting: Vascular Surgery

## 2022-10-17 VITALS — BP 134/77 | HR 81

## 2022-10-17 DIAGNOSIS — M7989 Other specified soft tissue disorders: Secondary | ICD-10-CM | POA: Diagnosis not present

## 2022-10-17 DIAGNOSIS — I82431 Acute embolism and thrombosis of right popliteal vein: Secondary | ICD-10-CM | POA: Insufficient documentation

## 2022-10-17 MED ORDER — APIXABAN (ELIQUIS) VTE STARTER PACK (10MG AND 5MG)
ORAL_TABLET | ORAL | 0 refills | Status: DC
  Filled 2022-10-17: qty 74, 30d supply, fill #0

## 2022-10-17 MED ORDER — APIXABAN 5 MG PO TABS: 5.0000 mg | ORAL_TABLET | Freq: Two times a day (BID) | ORAL | 5 refills | Status: AC

## 2022-10-17 NOTE — Patient Instructions (Addendum)
-  Start apixaban (Eliquis) 10 mg twice daily for 7 days followed by 5 mg twice daily. -Your refills have been sent to [facility] [place]. You may need to call the pharmacy to ask them to fill this when you start to run low on your current supply.  -Please call to schedule follow up with your hematologist/oncologist.  -It is important to take your medication around the same time every day.  -Avoid NSAIDs like ibuprofen (Advil, Motrin) and naproxen (Aleve) as well as aspirin doses over 100 mg daily. -Tylenol (acetaminophen) is the preferred over the counter pain medication to lower the risk of bleeding. -Be sure to alert all of your health care providers that you are taking an anticoagulant prior to starting a new medication or having a procedure. -Monitor for signs and symptoms of bleeding (abnormal bruising, prolonged bleeding, nose bleeds, bleeding from gums, discolored urine, black tarry stools). If you have fallen and hit your head OR if your bleeding is severe or not stopping, seek emergency care.  -Go to the emergency room if emergent signs and symptoms of new clot occur (new or worse swelling and pain in an arm or leg, shortness of breath, chest pain, fast or irregular heartbeats, lightheadedness, dizziness, fainting, coughing up blood) or if you experience a significant color change (pale or blue) in the extremity that has the DVT.  -We recommend you wear compression stockings (20-30 mmHg) as long as you are having swelling or pain. Be sure to purchase the correct size and take them off at night.   Your next visit is on Monday, August 19th at 1:30pm.  [facility] [address] [place], [place], [place] [address] Enter the hospital through Entrance C off [facility] and pull up to the [facility] entrance to the free valet parking.  Check in for your appointment at the [facility].   If you have any questions or need to reschedule an  appointment, please call [phone] [facility].  If you are having an emergency, call 911 or present to the nearest emergency room.   What is a DVT?  -Deep vein thrombosis (DVT) is a condition in which a blood clot forms in a vein of the deep venous system which can occur in the lower leg, thigh, pelvis, arm, or neck. This condition is serious and can be life-threatening if the clot travels to the arteries of the lungs and causing a blockage (pulmonary embolism, PE). A DVT can also damage veins in the leg, which can lead to long-term venous disease, leg pain, swelling, discoloration, and ulcers or sores (post-thrombotic syndrome).  -Treatment may include taking an anticoagulant medication to prevent more clots from forming and the current clot from growing, wearing compression stockings, and/or surgical procedures to remove or dissolve the clot.

## 2022-10-17 NOTE — Progress Notes (Cosign Needed)
DVT Clinic Note  Name: Kelsey York     MRN: [id number]     DOB: Jan 21, 1946     Sex: female  PCP: Kelsey York, Kelsey York  [id number] Visit: Visit Information: Initial Visit  Referred to DVT Clinic by: Dr. Theresia Lo [facility] ED)  Referred to CPP by: Dr. Karin Lieu Reason for referral:  Chief Complaint  Patient presents with   DVT   HISTORY OF PRESENT ILLNESS: Kelsey York is a 77 y.o. female with PMH CHF, asthma, COPD, HTN, pulmonary HTN, CKD, non-insulin dependent type 2 diabetes, polymyalgia rheumatica, OSA, hyperparathyroidism, TIA, MGUS, who presents after diagnosis of DVT for medication management. Patient presents in a wheelchair and is accompanied by her husband Kelsey York. Patient is a retired Public house manager. Reports that pain and swelling started about 10 days ago and have continued to worsen. On 10/13/22, she presented to the ED in [place], [place], where she lives and was discharged with a 10 day supply of Eliquis 5 mg BID due to elevated D-dimer and suspicion for DVT with plans for a follow up outpatient ultrasound to confirm the presence of DVT after the holiday weekend. She then presented to [facility] ED yesterday 10/16/22 with worsening swelling. She returned today for vascular ultrasound which showed age-indeterminate DVT of her right popliteal, peroneal, and posterior tibial veins. She was referred to DVT Clinic to start treatment. She denies any personal or family history of VTE. She reports that she sits in a chair at home for most of the day due to dizziness upon standing and having low stamina due to other health conditions. She gets up from the chair to go to the bathroom, eat, or move around the house. This has been a change within the past year. Her last COVID infection was in March 2024. No recent trauma or prolonged travel. She explains that MGUS can be a precursor to multiple myeloma so she sees her oncologist regularly for labs and monitoring. She reports the last check was in June and was  normal. She is up to date otherwise on cancer screenings. She has some SOB at baseline related to CHF, asthma, and COPD. This is at her baseline today. No chest pain or palpitations.  Positive Thrombotic Risk Factors: Obesity, Older Age, Other (comment) (prolonged immobility (sitting most the day)) Bleeding Risk Factors: Age >65 years, Antiplatelet therapy  Negative Thrombotic Risk Factors: Previous VTE, Sedentary journey lasting >8 hours within 4 weeks, Smoking, Estrogen therapy, Testosterone therapy, Active cancer, Recent cesarean section (within 3 months), Within 6 weeks postpartum, Erythropoiesis-stimulating agent, Non-malignant, chronic inflammatory condition, Known thrombophilic condition, Central venous catheterization, Pregnancy, Recent COVID diagnosis (within 3 months), Recent trauma (within 3 months), Recent surgery (within 3 months), Bed rest >72 hours within 3 months, Paralysis, paresis, or recent plaster cast immobilization of lower extremity, Recent admission to hospital with acute illness (within 3 months)  Rx Insurance Coverage: [employer] Rx Affordability: Eliquis is ~$100/30 day supply. Provided patient with copay card to reduce cost of refills to $10/month. Filled starter pack today for $0 using one time free card.  Preferred Pharmacy: Filled starter pack at [facility] during today's visit.   Past Medical History:  Diagnosis Date   Arthritis    Asthma    CHF (congestive heart failure) (HCC)    Chronic kidney disease    COPD (chronic obstructive pulmonary disease) (HCC)    Diverticula, colon    GERD (gastroesophageal reflux disease)    H/O hiatal hernia  Headache(784.0)    Hypertension    Hypokalemia    Left ventricular dysfunction    Pneumonia    Shortness of breath     Past Surgical History:  Procedure Laterality Date   ABDOMINAL HYSTERECTOMY     BILATERAL SALPINGOOPHORECTOMY     BUNIONECTOMY  08/24/2011   Procedure:  BUNIONECTOMY;  Surgeon: Harvie Junior, MD;  Location: [facility];  Service: Orthopedics;  Laterality: Left;  chevron bunionectomy   CHOLECYSTECTOMY     COLONOSCOPY W/ BIOPSIES AND POLYPECTOMY     CYSTOSCOPY W/ URETEROSCOPY W/ LITHOTRIPSY     CYSTOSCOPY/RETROGRADE/URETEROSCOPY/STONE EXTRACTION WITH BASKET     LITHOTRIPSY     SEPTOPLASTY     TONSILLECTOMY      Social History   Socioeconomic History   Marital status: Married    Spouse name: Not on file   Number of children: Not on file   Years of education: Not on file   Highest education level: Not on file  Occupational History   Not on file  Tobacco Use   Smoking status: Former    Types: Cigarettes    Quit date: 08/21/1989    Years since quitting: 33.1   Smokeless tobacco: Not on file  Substance and Sexual Activity   Alcohol use: Yes   Drug use: No   Sexual activity: Not on file  Other Topics Concern   Not on file  Social History Narrative   Not on file   Social Determinants of Health   Financial Resource Strain: Not on file  Food Insecurity: Not on file  Transportation Needs: Not on file  Physical Activity: Not on file  Stress: Not on file  Social Connections: Not on file  Intimate Partner Violence: Not on file    No family history on file.  Allergies as of 10/17/2022 - Review Complete 10/17/2022  Allergen Reaction Noted   Bee venom Anaphylaxis and Other (See Comments) 01/31/2012   Ampicillin Nausea And Vomiting 08/22/2011   Aspirin Other (See Comments) 10/17/2022   Atorvastatin Other (See Comments) 05/20/2016   Codeine Nausea And Vomiting 08/22/2011   Darvocet a500 [propoxyphene n-acetaminophen] Nausea And Vomiting 08/22/2011   Dilantin [phenytoin sodium extended] Hives 08/22/2011   Flagyl [metronidazole] Hives and Swelling 08/22/2011   Morphine and codeine Hives and Itching 08/22/2011   Propoxyphene Nausea Only and Nausea And Vomiting 05/22/2012   Sulfa antibiotics Hives 08/22/2011    Current  Outpatient Medications on File Prior to Encounter  Medication Sig Dispense Refill   acidophilus (RISAQUAD) CAPS Take by mouth daily.     albuterol (PROVENTIL) (5 MG/ML) 0.5% nebulizer solution Take 2.5 mg by nebulization every 6 (six) hours as needed.     albuterol (VENTOLIN HFA) 108 (90 Base) MCG/ACT inhaler Inhale 1-2 puffs into the lungs every 6 (six) hours as needed for wheezing or shortness of breath.     aspirin EC 81 MG tablet Take 81 mg by mouth daily.     atenolol (TENORMIN) 50 MG tablet Take 1 tablet by mouth daily.     Budeson-Glycopyrrol-Formoterol 160-9-4.8 MCG/ACT AERO Inhale 2 puffs into the lungs 2 (two) times daily.     Cholecalciferol 50 MCG (2000 UT) TABS Take 2,000 Units by mouth daily.     cyclobenzaprine (FLEXERIL) 10 MG tablet Take 10 mg by mouth 3 (three) times daily as needed.     dapagliflozin propanediol (FARXIGA) 5 MG TABS tablet Take 1 tablet by mouth daily.     doxycycline (VIBRA-TABS) 100 MG tablet  Take 100 mg by mouth 2 (two) times daily. 1-2 tablets left     ezetimibe (ZETIA) 10 MG tablet Take 1 tablet by mouth daily.     fluticasone (FLONASE) 50 MCG/ACT nasal spray Place 1 spray into both nostrils daily as needed for rhinitis or allergies.     furosemide (LASIX) 40 MG tablet Take 40 mg by mouth every other day.     guaiFENesin (MUCINEX) 600 MG 12 hr tablet Take 1,200 mg by mouth 2 (two) times daily.     metoCLOPramide (REGLAN) 10 MG tablet Take 10 mg by mouth every 6 (six) hours.     montelukast (SINGULAIR) 10 MG tablet Take 10 mg by mouth at bedtime.     nitrofurantoin, macrocrystal-monohydrate, (MACROBID) 100 MG capsule Take 100 mg by mouth every 12 (twelve) hours. Has 1-2 capsules left     ondansetron (ZOFRAN-ODT) 4 MG disintegrating tablet Take 4 mg by mouth 2 (two) times daily as needed for nausea or vomiting.     potassium chloride (MICRO-K) 10 MEQ CR capsule Take 20 mEq by mouth 2 (two) times daily as needed.     predniSONE (DELTASONE) 5 MG tablet Take 2  tablets by mouth daily.     spironolactone (ALDACTONE) 100 MG tablet Take 1 tablet by mouth daily.     tamsulosin (FLOMAX) 0.4 MG CAPS capsule Take 0.4 mg by mouth daily.     EPINEPHrine (EPIPEN JR) 0.15 MG/0.3ML injection Inject 0.15 mg into the muscle as needed.     promethazine (PHENERGAN) 25 MG tablet Take 25 mg by mouth every 6 (six) hours as needed. (Patient not taking: Reported on 10/17/2022)     No current facility-administered medications on file prior to encounter.   REVIEW OF SYSTEMS:  Review of Systems  Respiratory:  Negative for shortness of breath.   Cardiovascular:  Positive for leg swelling. Negative for chest pain and palpitations.  Musculoskeletal:  Positive for myalgias.  Neurological:  Positive for dizziness (related to orthostatic hypotension) and tingling.   PHYSICAL EXAMINATION:  Vitals:   10/17/22 1352  BP: 134/77  Pulse: 81  SpO2: 100%   Physical Exam Vitals reviewed.  Cardiovascular:     Rate and Rhythm: Normal rate.  Pulmonary:     Effort: Pulmonary effort is normal.  Musculoskeletal:        General: Tenderness present.     Right lower leg: Edema (1+ pitting edema) present.  Skin:    Findings: Bruising (left medial upper arm 2/2 line placed in ED for labs) present.  Psychiatric:        Mood and Affect: Mood normal.        Behavior: Behavior normal.        Thought Content: Thought content normal.   Villalta Score for Post-Thrombotic Syndrome: Pain: Moderate Cramps: Moderate Heaviness: Severe Paresthesia: Moderate Pruritus: Absent Pretibial Edema: Moderate Skin Induration: Absent Hyperpigmentation: Mild Redness: Absent Venous Ectasia: Mild Pain on calf compression: Severe Villalta Preliminary Score: 16 Is venous ulcer present?: No If venous ulcer is present and score is <15, then 15 points total are assigned: Absent Villalta Total Score: 16  LABS:  CBC     Component Value Date/Time   WBC 13.5 (H) 10/16/2022 2029   RBC 4.61 10/16/2022  2029   HGB 13.6 10/16/2022 2029   HCT 42.4 10/16/2022 2029   PLT 242 10/16/2022 2029   MCV 92.0 10/16/2022 2029   MCH 29.5 10/16/2022 2029   MCHC 32.1 10/16/2022 2029   RDW 16.3 (H)  10/16/2022 2029   LYMPHSABS 2.2 10/16/2022 2029   MONOABS 0.8 10/16/2022 2029   EOSABS 0.0 10/16/2022 2029   BASOSABS 0.1 10/16/2022 2029    Hepatic Function      Component Value Date/Time   PROT 6.7 10/16/2022 2029   ALBUMIN 3.5 10/16/2022 2029   AST 22 10/16/2022 2029   ALT 26 10/16/2022 2029   ALKPHOS 53 10/16/2022 2029   BILITOT 0.4 10/16/2022 2029   BILIDIR <0.1 10/16/2022 2029   IBILI NOT CALCULATED 10/16/2022 2029    Renal Function   Lab Results  Component Value Date   CREATININE 1.14 (H) 10/16/2022   CREATININE 1.10 08/24/2011   CREATININE 0.91 05/26/2008    Estimated Creatinine Clearance: 47.1 mL/min (A) (by C-G formula based on SCr of 1.14 mg/dL (H)).   [facility] Vascular Lab Studies:  10/17/22  VAS [place] LOWER EXTREMITY VENOUS (DVT)RIGHT Summary:  RIGHT:  - Findings consistent with age indeterminate deep vein thrombosis  involving the right popliteal vein, right peroneal veins, and right  posterior tibial veins.    LEFT:  - No evidence of common femoral vein obstruction.   ASSESSMENT: Location of DVT: Right popliteal vein, Right distal vein Cause of DVT: provoked by a persistent risk factor - prolonged immobility is the patient's most significant risk factor for DVT. She sits for the majority of the day and this has been a change for her within the past year. She has dizziness upon standing and cannot stand for longer than 15 minutes due to decreased stamina from her other health conditions. She is at increased risk for multiple myeloma given her history of MGUS but she reports her labs checked last month were stable. I think she would benefit from seeing a hematologist for further work up to determine if there are any other risk factors involved to help determine a duration of treatment for  anticoagulation. She is already established with one for monitoring of MGUS and she will call them to schedule a follow up visit to discuss her DVT. If prolonged immobility remains the only risk factor found, this would be a persistent risk factor for the patient necessitating long-term anticoagulation as long as benefits outweigh the risks. She may be a good candidate for decreased maintenance dosing after 6 months of full dose anticoagulation, but we will defer that to her hematologist/oncologist if the decision is made to continue indefinitely.   She was started on Eliquis in the [place] ED last week. She has been taking 5 mg BID. Now that she has a confirmed DVT, we need to increase the dose, as below. No contraindications or drug interactions with Eliquis today. Labs yesterday from the ED were not concerning for Eliquis start. Provided extensive counseling on Eliquis and the patient confirmed understanding.  PLAN: -Start apixaban (Eliquis) 10 mg twice daily for 7 days followed by 5 mg twice daily. -Expected duration of therapy: Likely long-term but will defer to her hematologist whom she is already established with. Therapy started on 10/17/22. -Patient educated on purpose, proper use and potential adverse effects of apixaban (Eliquis). -Discussed importance of taking medication around the same time every day. -Advised patient of medications to avoid (NSAIDs, aspirin doses >100 mg daily). -Educated that Tylenol (acetaminophen) is the preferred analgesic to lower the risk of bleeding. -Advised patient to alert all providers of anticoagulation therapy prior to starting a new medication or having a procedure. -Emphasized importance of monitoring for signs and symptoms of bleeding (abnormal bruising, prolonged bleeding, nose bleeds, bleeding  from gums, discolored urine, black tarry stools). -Educated patient to present to the ED if emergent signs and symptoms of new thrombosis occur. -Counseled  patient to wear compression stockings daily, removing at night. Encouraged elevation as well.  -Helped patient sign up for [facility] MyChart so she can view her results and other health information and share them with her daughter.  -Eliquis starter pack was filled today during the visit at the [facility]. [facility] [place].  -Activated copay card for Eliquis to reduce cost of future refills and provided this to her pharmacy.   Follow up: 6 weeks in DVT Clinic. She will schedule sooner follow up with her hematologist/oncologist to discuss her new DVT.   Pervis Hocking, PharmD, [place], CPP Deep Vein Thrombosis Clinic Clinical Pharmacist Practitioner Office: [phone]  I have evaluated the patient's chart/imaging and refer this patient to the Clinical Pharmacist Practitioner for medication management. I have reviewed the CPP's documentation and agree with her assessment and plan. I was immediately available during the visit for questions and collaboration.   Victorino Sparrow, MD

## 2022-10-25 ENCOUNTER — Telehealth (HOSPITAL_COMMUNITY): Payer: Self-pay | Admitting: Student-PharmD

## 2022-10-25 NOTE — Telephone Encounter (Signed)
Patient called this morning stating she began to notice blood in her urine yesterday when she woke up and it has continued today. It's bright red in the mornings and then pink as the day goes on. She saw her urologist within the past couple of weeks and CT showed she had two kidney stones. She called her urologist about the bleeding and saw an NP who said all she can do is hold Eliquis and to reach out to DVT Clinic. The patient did not want to stop Eliquis without talking with [place] first. While I was on the phone with the patient, her urology office called and I spoke with Jenelle Mages, NP from [facility] in [place]. She explained that the patient was having gross hematuria x 2 days. They do not feel like the 2 small stones found on CT a couple weeks ago are contributing to the bleeding. The stones were small and causing no obstruction so there's nothing for them to do. I told her we would reach out to the patient with further instructions for anticoagulation.   Patient is on day 7 of the Eliquis starter pack, so today is her last day of taking 10 mg BID. She will then decrease to 5 mg BID. I spoke with vascular surgeon Dr. Edilia Bo and he recommends the patient continue Eliquis and monitor for symptom improvement with decreasing the dose to 5 mg BID. She's due to drop down to that dose tomorrow morning but we will go ahead and have her do this tonight. If bleeding does not improve within the next week, she would need to see one of the vascular surgeons to discuss a temporary IVC filter. I relayed this information to the patient who confirmed understanding. She will reach out next week with an update.

## 2022-11-02 NOTE — Telephone Encounter (Signed)
Patient called updating me that the swelling in her leg and foot have improved, but she is still having blood in her urine on Eliquis. It is very dark in the morning and turns pink in the afternoon. No changes (worsening or improvement) since last week. I talked with Dr. Lenell Antu, and he will coordinate getting her scheduled at [facility] for discussion on an IVC filter. I let the patient know she should expect to hear from them soon.

## 2022-11-03 ENCOUNTER — Telehealth: Payer: Self-pay | Admitting: Vascular Surgery

## 2022-11-03 NOTE — Telephone Encounter (Signed)
-----   Message from Leonie Douglas sent at 11/02/2022  4:44 PM EDT ----- Regarding: FW: Blood in urine on Eliquis - IVC filter? Please work in to clinic ASAP to discuss IVC filter. Any MD. Thanks. Tom ----- Message ----- From: Dicie Beam, RPH-CPP Sent: 11/02/2022   3:29 PM EDT To: Leonie Douglas, MD Subject: Blood in urine on Eliquis - IVC filter?        Here's the patient we just talked about. There's a telephone note in her chart with details from my conversation with her last week, and I'll add in what she told me today too. Thanks for your help!   [employer]

## 2022-11-10 ENCOUNTER — Encounter: Payer: Self-pay | Admitting: Vascular Surgery

## 2022-11-10 ENCOUNTER — Ambulatory Visit: Payer: Federal, State, Local not specified - PPO | Admitting: Vascular Surgery

## 2022-11-10 VITALS — BP 139/86 | HR 65 | Temp 98.0°F | Resp 20 | Ht 63.0 in | Wt 205.0 lb

## 2022-11-10 DIAGNOSIS — I82401 Acute embolism and thrombosis of unspecified deep veins of right lower extremity: Secondary | ICD-10-CM

## 2022-11-10 NOTE — Progress Notes (Signed)
ASSESSMENT & PLAN   RIGHT LOWER EXTREMITY DVT: This patient has a DVT in the right lower extremity.  The age of the clot is not clear.  Regardless given that she had symptoms we are assuming that it is subacute.  She was started on Eliquis.  Based on her history and the picture she showed me of her urine I am not convinced that she is having significant bleeding from her Eliquis.  In addition the dose has now been cut back to 5 mg twice daily.  I have recommended that we follow this closely and only consider placement of an IVC filter if she develops clear-cut evidence of significant hematuria.  We have discussed the importance of leg elevation.  She will call if she develops significant hematuria.  If she does I think we should proceed with placement of an IVC filter and we have discussed the indications for that and the potential complications.  I will see her as needed.  REASON FOR CONSULT:    History of right lower extremity DVT / consider for IVC filter  HPI:   Kelsey York is a 77 y.o. female who had developed some swelling and pain in the right leg in early July.  This prompted a duplex scan which showed evidence of an clot of indeterminate age in the right popliteal vein, peroneal veins, and posterior tibial veins.  According to the tech the clot did not look acute but given her symptoms presumably this was subacute.  She was started on Eliquis at that time with a starter pack.  The dose has now been cut back to 5 mg twice daily.  She had developed an issue where in the morning when she woke up she had some brownish discoloration in her urine.  As the day would go on this would clear up.  She showed me a picture of this and it did not appear bright red but darkish brown.  She is scheduled to see a urologist.  She was referred here because of potential bleeding related to her Eliquis and we were asked to consider placing an IVC filter.  She does have a history of chronic kidney disease  stage III.  Past Medical History:  Diagnosis Date   Arthritis    Asthma    CHF (congestive heart failure) (HCC)    Chronic kidney disease    COPD (chronic obstructive pulmonary disease) (HCC)    Diverticula, colon    GERD (gastroesophageal reflux disease)    H/O hiatal hernia    Headache(784.0)    Hypertension    Hypokalemia    Left ventricular dysfunction    Pneumonia    Shortness of breath     History reviewed. No pertinent family history.  SOCIAL HISTORY: Social History   Tobacco Use   Smoking status: Former    Current packs/day: 0.00    Types: Cigarettes    Quit date: 08/21/1989    Years since quitting: 33.2   Smokeless tobacco: Not on file  Substance Use Topics   Alcohol use: Yes    Allergies  Allergen Reactions   Bee Venom Anaphylaxis and Other (See Comments)   Ampicillin Nausea And Vomiting   Aspirin Other (See Comments)    ulcers   Atorvastatin Other (See Comments)    Other reaction(s): decreased blood pressure   Codeine Nausea And Vomiting   Darvocet A500 [Propoxyphene N-Acetaminophen] Nausea And Vomiting   Dilantin [Phenytoin Sodium Extended] Hives   Flagyl [Metronidazole] Hives and Swelling  Morphine And Codeine Hives and Itching   Propoxyphene Nausea Only and Nausea And Vomiting   Sulfa Antibiotics Hives    Current Outpatient Medications  Medication Sig Dispense Refill   acidophilus (RISAQUAD) CAPS Take by mouth daily.     albuterol (PROVENTIL) (5 MG/ML) 0.5% nebulizer solution Take 2.5 mg by nebulization every 6 (six) hours as needed.     albuterol (VENTOLIN HFA) 108 (90 Base) MCG/ACT inhaler Inhale 1-2 puffs into the lungs every 6 (six) hours as needed for wheezing or shortness of breath.     APIXABAN (ELIQUIS) VTE STARTER PACK (10MG  AND 5MG ) Take as directed on package: start with two-5mg  tablets twice daily for 7 days. On day 8, switch to one-5mg  tablet twice daily. 74 each 0   aspirin EC 81 MG tablet Take 81 mg by mouth daily.     atenolol  (TENORMIN) 50 MG tablet Take 1 tablet by mouth daily.     Budeson-Glycopyrrol-Formoterol 160-9-4.8 MCG/ACT AERO Inhale 2 puffs into the lungs 2 (two) times daily.     Cholecalciferol 50 MCG (2000 UT) TABS Take 2,000 Units by mouth daily.     cyclobenzaprine (FLEXERIL) 10 MG tablet Take 10 mg by mouth 3 (three) times daily as needed.     dapagliflozin propanediol (FARXIGA) 5 MG TABS tablet Take 1 tablet by mouth daily.     EPINEPHrine (EPIPEN JR) 0.15 MG/0.3ML injection Inject 0.15 mg into the muscle as needed.     ezetimibe (ZETIA) 10 MG tablet Take 1 tablet by mouth daily.     fluticasone (FLONASE) 50 MCG/ACT nasal spray Place 1 spray into both nostrils daily as needed for rhinitis or allergies.     furosemide (LASIX) 40 MG tablet Take 40 mg by mouth every other day.     guaiFENesin (MUCINEX) 600 MG 12 hr tablet Take 1,200 mg by mouth 2 (two) times daily.     montelukast (SINGULAIR) 10 MG tablet Take 10 mg by mouth at bedtime.     nitrofurantoin, macrocrystal-monohydrate, (MACROBID) 100 MG capsule Take 100 mg by mouth every 12 (twelve) hours. Has 1-2 capsules left     ondansetron (ZOFRAN-ODT) 4 MG disintegrating tablet Take 4 mg by mouth 2 (two) times daily as needed for nausea or vomiting.     potassium chloride (MICRO-K) 10 MEQ CR capsule Take 20 mEq by mouth 2 (two) times daily as needed.     predniSONE (DELTASONE) 5 MG tablet Take 2 tablets by mouth daily.     promethazine (PHENERGAN) 25 MG tablet Take 25 mg by mouth every 6 (six) hours as needed.     spironolactone (ALDACTONE) 100 MG tablet Take 1 tablet by mouth daily.     tamsulosin (FLOMAX) 0.4 MG CAPS capsule Take 0.4 mg by mouth daily.     apixaban (ELIQUIS) 5 MG TABS tablet Take 1 tablet (5 mg total) by mouth 2 (two) times daily. Start taking after completion of starter pack. (Patient not taking: Reported on 11/10/2022) 60 tablet 5   metoCLOPramide (REGLAN) 10 MG tablet Take 10 mg by mouth every 6 (six) hours. (Patient not taking:  Reported on 11/10/2022)     No current facility-administered medications for this visit.    REVIEW OF SYSTEMS:  [X]  denotes positive finding, [ ]  denotes negative finding Cardiac  Comments:  Chest pain or chest pressure:    Shortness of breath upon exertion: x   Short of breath when lying flat: x   Irregular heart rhythm:  Vascular    Pain in calf, thigh, or hip brought on by ambulation:    Pain in feet at night that wakes you up from your sleep:  x   Blood clot in your veins:    Leg swelling:         Pulmonary    Oxygen at home:    Productive cough:     Wheezing:  x       Neurologic    Sudden weakness in arms or legs:     Sudden numbness in arms or legs:     Sudden onset of difficulty speaking or slurred speech:    Temporary loss of vision in one eye:     Problems with dizziness:  x       Gastrointestinal    Blood in stool:     Vomited blood:         Genitourinary    Burning when urinating:     Blood in urine: xx       Psychiatric    Major depression:         Hematologic    Bleeding problems:    Problems with blood clotting too easily:        Skin    Rashes or ulcers: x       Constitutional    Fever or chills:    -  PHYSICAL EXAM:   Vitals:   11/10/22 1307  BP: 139/86  Pulse: 65  Resp: 20  Temp: 98 F (36.7 C)  SpO2: 94%  Weight: 205 lb (93 kg)  Height: 5\' 3"  (1.6 m)   Body mass index is 36.31 kg/m. GENERAL: The patient is a well-nourished female, in no acute distress. The vital signs are documented above. CARDIAC: There is a regular rate and rhythm.  VASCULAR: I do not detect carotid bruits. She has mild bilateral lower extremity swelling. Both feet are warm and well-perfused. PULMONARY: There is good air exchange bilaterally without wheezing or rales. ABDOMEN: Soft and non-tender with normal pitched bowel sounds.  MUSCULOSKELETAL: There are no major deformities. NEUROLOGIC: No focal weakness or paresthesias are detected. SKIN: There  are no ulcers or rashes noted. PSYCHIATRIC: The patient has a normal affect.  DATA:    VENOUS DUPLEX: I reviewed the venous duplex scan from 10/17/2022.  This showed evidence of a DVT involving the right popliteal vein, peroneal vein, and posterior tibial veins.  This was of indeterminate age.  Kelsey York [facility]

## 2022-11-28 ENCOUNTER — Ambulatory Visit (HOSPITAL_COMMUNITY): Payer: Federal, State, Local not specified - PPO

## 2022-12-06 ENCOUNTER — Ambulatory Visit (HOSPITAL_COMMUNITY): Payer: Federal, State, Local not specified - PPO

## 2023-01-04 ENCOUNTER — Ambulatory Visit (HOSPITAL_COMMUNITY)
Admission: RE | Admit: 2023-01-04 | Discharge: 2023-01-04 | Disposition: A | Discharge status: Home / Self Care | Payer: Federal, State, Local not specified - PPO | Source: Ambulatory Visit | Attending: Surgery | Admitting: Surgery

## 2023-01-04 DIAGNOSIS — I82431 Acute embolism and thrombosis of right popliteal vein: Secondary | ICD-10-CM

## 2023-01-04 NOTE — Progress Notes (Cosign Needed Addendum)
DVT Clinic Note  Name: Kelsey York     MRN: [id number]     DOB: 05/29/1945     Sex: female  PCP: Legrand Pitts, [place]  OZHYQ'M Visit: Visit Information: Follow Up Visit  Referred to DVT Clinic by: Emergency Department - Dr. Theresia Lo [facility] ED)  Referred to CPP by: Dr. Myra Gianotti Reason for referral:  Chief Complaint  Patient presents with   Med Management - DVT   HISTORY OF PRESENT ILLNESS: Kelsey York is a 77 y.o. female  with PMH CHF, asthma, COPD, HTN, pulmonary HTN, CKD, non-insulin dependent type 2 diabetes, polymyalgia rheumatica, OSA, hyperparathyroidism, TIA, MGUS, who presents for follow up medication management of DVT. On 10/13/22, she presented to the ED in [place], [place], where she lives and was discharged with a 10 day supply of Eliquis 5 mg BID due to elevated D-dimer and suspicion for DVT given symptoms of pain and swelling with plans for a follow up outpatient ultrasound to confirm the presence of DVT after the holiday weekend. She then presented to [facility] ED yesterday 10/16/22 with worsening swelling. She returned 10/17/22 for vascular ultrasound which showed age-indeterminate DVT of her right popliteal, peroneal, and posterior tibial veins. She was seen in DVT Clinic at that time and Eliquis was started. DVT felt to be related to prolonged immobility since the patient sits in a chair at home for most of the day due to her comorbid health conditions and inability to stand for long periods of time without getting dizzy. This has been a significant change for her within the past year. Since her last visit, she experienced a persistent brownish red discoloration of her urine. She was seen by Dr[facility] Edilia Bo at [facility] who felt that there was not significant bleeding and that Eliquis could be continued.  Today, patient presents in a wheelchair and is accompanied by her husband [name]. She reports that the pain and swelling in her leg have resolved. The blood tinged urine she was  experienced also resolved about a month ago. Denies abnormal bleeding or bruising. Denies missed doses of Eliquis. She has some SOB at baseline related to CHF, asthma, and COPD, which is at baseline today.   Positive Thrombotic Risk Factors: Obesity, Older Age, Other (comment) (prolonged immobility) Bleeding Risk Factors: Anticoagulant therapy, Age >65 years, Antiplatelet therapy  Negative Thrombotic Risk Factors: Previous VTE, Recent surgery (within 3 months), Recent trauma (within 3 months), Recent admission to hospital with acute illness (within 3 months), Paralysis, paresis, or recent plaster cast immobilization of lower extremity, Central venous catheterization, Bed rest >72 hours within 3 months, Sedentary journey lasting >8 hours within 4 weeks, Pregnancy, Within 6 weeks postpartum, Recent cesarean section (within 3 months), Estrogen therapy, Testosterone therapy, Erythropoiesis-stimulating agent, Recent COVID diagnosis (within 3 months), Active cancer, Non-malignant, chronic inflammatory condition, Known thrombophilic condition, Smoking  Rx Insurance Coverage: [employer] Rx Affordability: Eliquis is ~$100/30 day supply. Provided patient with copay card to reduce cost of refills to $10/month. Filled starter pack at last visit for $0 using one time free card.  Rx Assistance Provided:  Free 30-day trial card Co-pay card Preferred Pharmacy: [facility]   Past Medical History:  Diagnosis Date   Arthritis    Asthma    CHF (congestive heart failure) (HCC)    Chronic kidney disease    COPD (chronic obstructive pulmonary disease) (HCC)    Diverticula, colon    GERD (gastroesophageal reflux disease)  H/O hiatal hernia    Headache(784.0)    Hypertension    Hypokalemia    Left ventricular dysfunction    Pneumonia    Shortness of breath     Past Surgical History:  Procedure Laterality Date   ABDOMINAL HYSTERECTOMY     BILATERAL  SALPINGOOPHORECTOMY     BUNIONECTOMY  08/24/2011   Procedure: BUNIONECTOMY;  Surgeon: Harvie Junior, MD;  Location: [facility];  Service: Orthopedics;  Laterality: Left;  chevron bunionectomy   CHOLECYSTECTOMY     COLONOSCOPY W/ BIOPSIES AND POLYPECTOMY     CYSTOSCOPY W/ URETEROSCOPY W/ LITHOTRIPSY     CYSTOSCOPY/RETROGRADE/URETEROSCOPY/STONE EXTRACTION WITH BASKET     LITHOTRIPSY     SEPTOPLASTY     TONSILLECTOMY      Social History   Socioeconomic History   Marital status: Married    Spouse name: Not on file   Number of children: Not on file   Years of education: Not on file   Highest education level: Not on file  Occupational History   Not on file  Tobacco Use   Smoking status: Former    Current packs/day: 0.00    Types: Cigarettes    Quit date: 08/21/1989    Years since quitting: 33.3   Smokeless tobacco: Not on file  Substance and Sexual Activity   Alcohol use: Yes   Drug use: No   Sexual activity: Not on file  Other Topics Concern   Not on file  Social History Narrative   Not on file   Social Determinants of Health   Financial Resource Strain: Not on file  Food Insecurity: Not on file  Transportation Needs: Not on file  Physical Activity: Not on file  Stress: Not on file  Social Connections: Not on file  Intimate Partner Violence: Not on file    No family history on file.  Allergies as of 01/04/2023 - Review Complete 01/04/2023  Allergen Reaction Noted   Bee venom Anaphylaxis and Other (See Comments) 01/31/2012   Ampicillin Nausea And Vomiting 08/22/2011   Aspirin Other (See Comments) 10/17/2022   Atorvastatin Other (See Comments) 05/20/2016   Codeine Nausea And Vomiting 08/22/2011   Darvocet a500 [propoxyphene n-acetaminophen] Nausea And Vomiting 08/22/2011   Dilantin [phenytoin sodium extended] Hives 08/22/2011   Flagyl [metronidazole] Hives and Swelling 08/22/2011   Morphine and codeine Hives and Itching 08/22/2011   Propoxyphene Nausea  Only and Nausea And Vomiting 05/22/2012   Sulfa antibiotics Hives 08/22/2011    Current Outpatient Medications on File Prior to Encounter  Medication Sig Dispense Refill   apixaban (ELIQUIS) 5 MG TABS tablet Take 1 tablet (5 mg total) by mouth 2 (two) times daily. Start taking after completion of starter pack. 60 tablet 5   LINZESS 145 MCG CAPS capsule Take 145 mcg by mouth daily before breakfast.     acidophilus (RISAQUAD) CAPS Take by mouth daily.     albuterol (PROVENTIL) (5 MG/ML) 0.5% nebulizer solution Take 2.5 mg by nebulization every 6 (six) hours as needed.     albuterol (VENTOLIN HFA) 108 (90 Base) MCG/ACT inhaler Inhale 1-2 puffs into the lungs every 6 (six) hours as needed for wheezing or shortness of breath.     aspirin EC 81 MG tablet Take 81 mg by mouth daily.     atenolol (TENORMIN) 50 MG tablet Take 1 tablet by mouth daily.     Budeson-Glycopyrrol-Formoterol 160-9-4.8 MCG/ACT AERO Inhale 2 puffs into the lungs 2 (two) times daily.  Cholecalciferol 50 MCG (2000 UT) TABS Take 2,000 Units by mouth daily.     cyclobenzaprine (FLEXERIL) 10 MG tablet Take 10 mg by mouth 3 (three) times daily as needed.     dapagliflozin propanediol (FARXIGA) 5 MG TABS tablet Take 1 tablet by mouth daily.     EPINEPHrine (EPIPEN JR) 0.15 MG/0.3ML injection Inject 0.15 mg into the muscle as needed.     ezetimibe (ZETIA) 10 MG tablet Take 1 tablet by mouth daily.     fluticasone (FLONASE) 50 MCG/ACT nasal spray Place 1 spray into both nostrils daily as needed for rhinitis or allergies.     furosemide (LASIX) 40 MG tablet Take 40 mg by mouth every other day.     guaiFENesin (MUCINEX) 600 MG 12 hr tablet Take 1,200 mg by mouth 2 (two) times daily.     metoCLOPramide (REGLAN) 10 MG tablet Take 10 mg by mouth every 6 (six) hours. (Patient not taking: Reported on 11/10/2022)     montelukast (SINGULAIR) 10 MG tablet Take 10 mg by mouth at bedtime.     nitrofurantoin, macrocrystal-monohydrate, (MACROBID) 100  MG capsule Take 100 mg by mouth every 12 (twelve) hours. Has 1-2 capsules left     ondansetron (ZOFRAN-ODT) 4 MG disintegrating tablet Take 4 mg by mouth 2 (two) times daily as needed for nausea or vomiting.     potassium chloride (MICRO-K) 10 MEQ CR capsule Take 20 mEq by mouth 2 (two) times daily as needed.     predniSONE (DELTASONE) 5 MG tablet Take 2 tablets by mouth daily.     promethazine (PHENERGAN) 25 MG tablet Take 25 mg by mouth every 6 (six) hours as needed.     spironolactone (ALDACTONE) 100 MG tablet Take 1 tablet by mouth daily.     tamsulosin (FLOMAX) 0.4 MG CAPS capsule Take 0.4 mg by mouth daily.     No current facility-administered medications on file prior to encounter.   REVIEW OF SYSTEMS:  Review of Systems  Respiratory:  Positive for shortness of breath (at baseline).   Cardiovascular:  Negative for chest pain, palpitations and leg swelling.  Musculoskeletal:  Negative for myalgias.  Neurological:  Positive for dizziness (at baseline) and tingling.   PHYSICAL EXAMINATION:  Physical Exam Musculoskeletal:        General: No swelling or tenderness.  Skin:    Findings: No bruising or erythema.   Villalta Score for Post-Thrombotic Syndrome: Pain: Mild Cramps: Absent Heaviness: Mild Paresthesia: Moderate Pruritus: Absent Pretibial Edema: Absent Skin Induration: Absent Hyperpigmentation: Absent Redness: Absent Venous Ectasia: Absent Pain on calf compression: Absent Villalta Preliminary Score: 4 Is venous ulcer present?: No If venous ulcer is present and score is <15, then 15 points total are assigned: Absent Villalta Total Score: 4  LABS:  CBC     Component Value Date/Time   WBC 13.5 (H) 10/16/2022 2029   RBC 4.61 10/16/2022 2029   HGB 13.6 10/16/2022 2029   HCT 42.4 10/16/2022 2029   PLT 242 10/16/2022 2029   MCV 92.0 10/16/2022 2029   MCH 29.5 10/16/2022 2029   MCHC 32.1 10/16/2022 2029   RDW 16.3 (H) 10/16/2022 2029   LYMPHSABS 2.2 10/16/2022  2029   MONOABS 0.8 10/16/2022 2029   EOSABS 0.0 10/16/2022 2029   BASOSABS 0.1 10/16/2022 2029    Hepatic Function      Component Value Date/Time   PROT 6.7 10/16/2022 2029   ALBUMIN 3.5 10/16/2022 2029   AST 22 10/16/2022 2029   ALT 26  10/16/2022 2029   ALKPHOS 53 10/16/2022 2029   BILITOT 0.4 10/16/2022 2029   BILIDIR <0.1 10/16/2022 2029   IBILI NOT CALCULATED 10/16/2022 2029    Renal Function   Lab Results  Component Value Date   CREATININE 1.14 (H) 10/16/2022   CREATININE 1.10 08/24/2011   CREATININE 0.91 05/26/2008    CrCl cannot be calculated (Patient's most recent lab result is older than the maximum 21 days allowed.).   [facility] Vascular Lab Studies:  10/17/22  VAS [place] LOWER EXTREMITY VENOUS (DVT)RIGHT Summary:  RIGHT:  - Findings consistent with age indeterminate deep vein thrombosis  involving the right popliteal vein, right peroneal veins, and right  posterior tibial veins.    LEFT:  - No evidence of common femoral vein obstruction.   ASSESSMENT: Location of DVT: Right popliteal vein, Right distal vein Cause of DVT: provoked by a persistent risk factor - prolonged immobility, comorbidities including chronic heart failure and obesity. The patient used to be more active but due to her health conditions causing SOB, weakness, and dizziness with standing or activity, she sits for the majority of every day. This was a change for her this year. She is followed by a hematologist outside of [facility]. We have recommended continuing anticoagulation as long as the risks outweigh the benefits in light of these persistent risk factors. She tells me today that her cardiologist was in agreement with her need for long-term anticoagulation. She is tolerating Eliquis well. The discoloration of her urine has resolved. She has been provided with 6 months of refills of Eliquis, after which time she will need to follow up with her PCP or hematologist for further monitoring and refills. No concerns  regarding medication access or affordability at this time.   PLAN: -Continue apixaban (Eliquis) 5 mg twice daily. -Expected duration of therapy: likely indefinite, as long as risk factors are present. Therapy started on 10/17/22. -Patient educated on purpose, proper use and potential adverse effects of apixaban (Eliquis). -Discussed importance of taking medication around the same time every day. -Advised patient of medications to avoid (NSAIDs, aspirin doses >100 mg daily). -Educated that Tylenol (acetaminophen) is the preferred analgesic to lower the risk of bleeding. -Advised patient to alert all providers of anticoagulation therapy prior to starting a new medication or having a procedure. -Emphasized importance of monitoring for signs and symptoms of bleeding (abnormal bruising, prolonged bleeding, nose bleeds, bleeding from gums, discolored urine, black tarry stools). -Educated patient to present to the ED if emergent signs and symptoms of new thrombosis occur. -Counseled patient to wear compression stockings daily, removing at night. Elevate legs as needed to help with swelling.  -Patient is discharged from the DVT Clinic. -Patient has been given refills to last 6 months.  -Future refills, prior authorizations, and applications for patient assistance programs will be the responsibility of the patient's primary care provider, who has been notified of this.  -Counseled patient on future VTE risk reduction strategies and to inform all future providers of DVT history.  Follow up: with PCP/hematology. No further follow up needed in DVT Clinic at this time.   Pervis Hocking, PharmD, [place], CPP Deep Vein Thrombosis Clinic Clinical Pharmacist Practitioner Office: [phone]  I have evaluated the patient's chart/imaging and refer this patient to the Clinical Pharmacist Practitioner for medication management. I have reviewed the CPP's documentation and agree with her assessment and plan. I was  immediately available during the visit for questions and collaboration.   Durene Cal, MD

## 2023-01-04 NOTE — Patient Instructions (Signed)
You have been discharged from the DVT Clinic! No further follow up in the DVT Clinic is needed.  -Continue apixaban (Eliquis) 5 mg twice daily. -You have been provided with 6 months of refills, [facility].  -Future refills will need to come from your primary care provider or hematology.  -It is important that you schedule and attend a follow up visit with your primary care provider within the next couple of months to ensure you do not run out of refills. If this is not already scheduled, we recommend you call to schedule this now.  -It is important to take your medication around the same time every day.  -Avoid NSAIDs like ibuprofen (Advil, Motrin) and naproxen (Aleve) as well as aspirin doses over 100 mg daily. -Tylenol (acetaminophen) is the preferred over the counter pain medication to lower the risk of bleeding. -Be sure to alert all of your health care providers that you are taking an anticoagulant prior to starting a new medication or having a procedure. -Monitor for signs and symptoms of bleeding (abnormal bruising, prolonged bleeding, nose bleeds, bleeding from gums, discolored urine, black tarry stools). If you have fallen and hit your head OR if your bleeding is severe or not stopping, seek emergency care.  -Go to the emergency room if emergent signs and symptoms of new clot occur (new or worse swelling and pain in an arm or leg, shortness of breath, chest pain, fast or irregular heartbeats, lightheadedness, dizziness, fainting, coughing up blood) or if you experience a significant color change (pale or blue) in the extremity that has the DVT.  -If you are having an emergency, call 911 or present to the nearest emergency room.   Please reach out if any questions come up. [phone] [facility].

## 2023-08-07 ENCOUNTER — Telehealth: Payer: Self-pay

## 2023-08-07 NOTE — Telephone Encounter (Signed)
 Medication management/Advice: -pt called about getting approval to stop her blood thinner for surgery.   -reviewed chart-pt advised on LOV that she would need her hematologist or PCP to manage her medications.  Pt states "well I talked to [place] and she said I needed to call my PCP" but then stated she already stopped her blood thinner b/c she needed to have surgery and wanted to know if we would sign off on that.   -strongly advised pt to talk to her PCP as previously advised.
